# Patient Record
Sex: Male | Born: 1955 | Race: Black or African American | Hispanic: No | Marital: Married | Smoking: Former smoker
Health system: Southern US, Community
[De-identification: ages and names within clinical notes are randomized; demographics above are authoritative.]

## PROBLEM LIST (undated history)

## (undated) DIAGNOSIS — L309 Dermatitis, unspecified: Secondary | ICD-10-CM

## (undated) DIAGNOSIS — I10 Essential (primary) hypertension: Secondary | ICD-10-CM

## (undated) DIAGNOSIS — M109 Gout, unspecified: Secondary | ICD-10-CM

## (undated) DIAGNOSIS — K219 Gastro-esophageal reflux disease without esophagitis: Secondary | ICD-10-CM

## (undated) DIAGNOSIS — J9819 Other pulmonary collapse: Secondary | ICD-10-CM

## (undated) DIAGNOSIS — M1990 Unspecified osteoarthritis, unspecified site: Secondary | ICD-10-CM

## (undated) HISTORY — DX: Essential (primary) hypertension: I10

## (undated) HISTORY — PX: ROTATOR CUFF REPAIR: SHX139

## (undated) HISTORY — DX: Unspecified osteoarthritis, unspecified site: M19.90

## (undated) HISTORY — PX: KNEE ARTHROSCOPY: SUR90

## (undated) HISTORY — DX: Gastro-esophageal reflux disease without esophagitis: K21.9

---

## 2009-08-17 IMAGING — CR DG CHEST 2V
2 series · 2 of 2 positions shown · non-contrast
Comparison: None

CLINICAL DATA: Preoperative evaluation for rotator cuff tear repair
and biceps tendon tear repair.  Nonsmoker.  Right lung surgery 6
years ago.

CHEST - 2 VIEW

[view not recorded (1 of 2)]
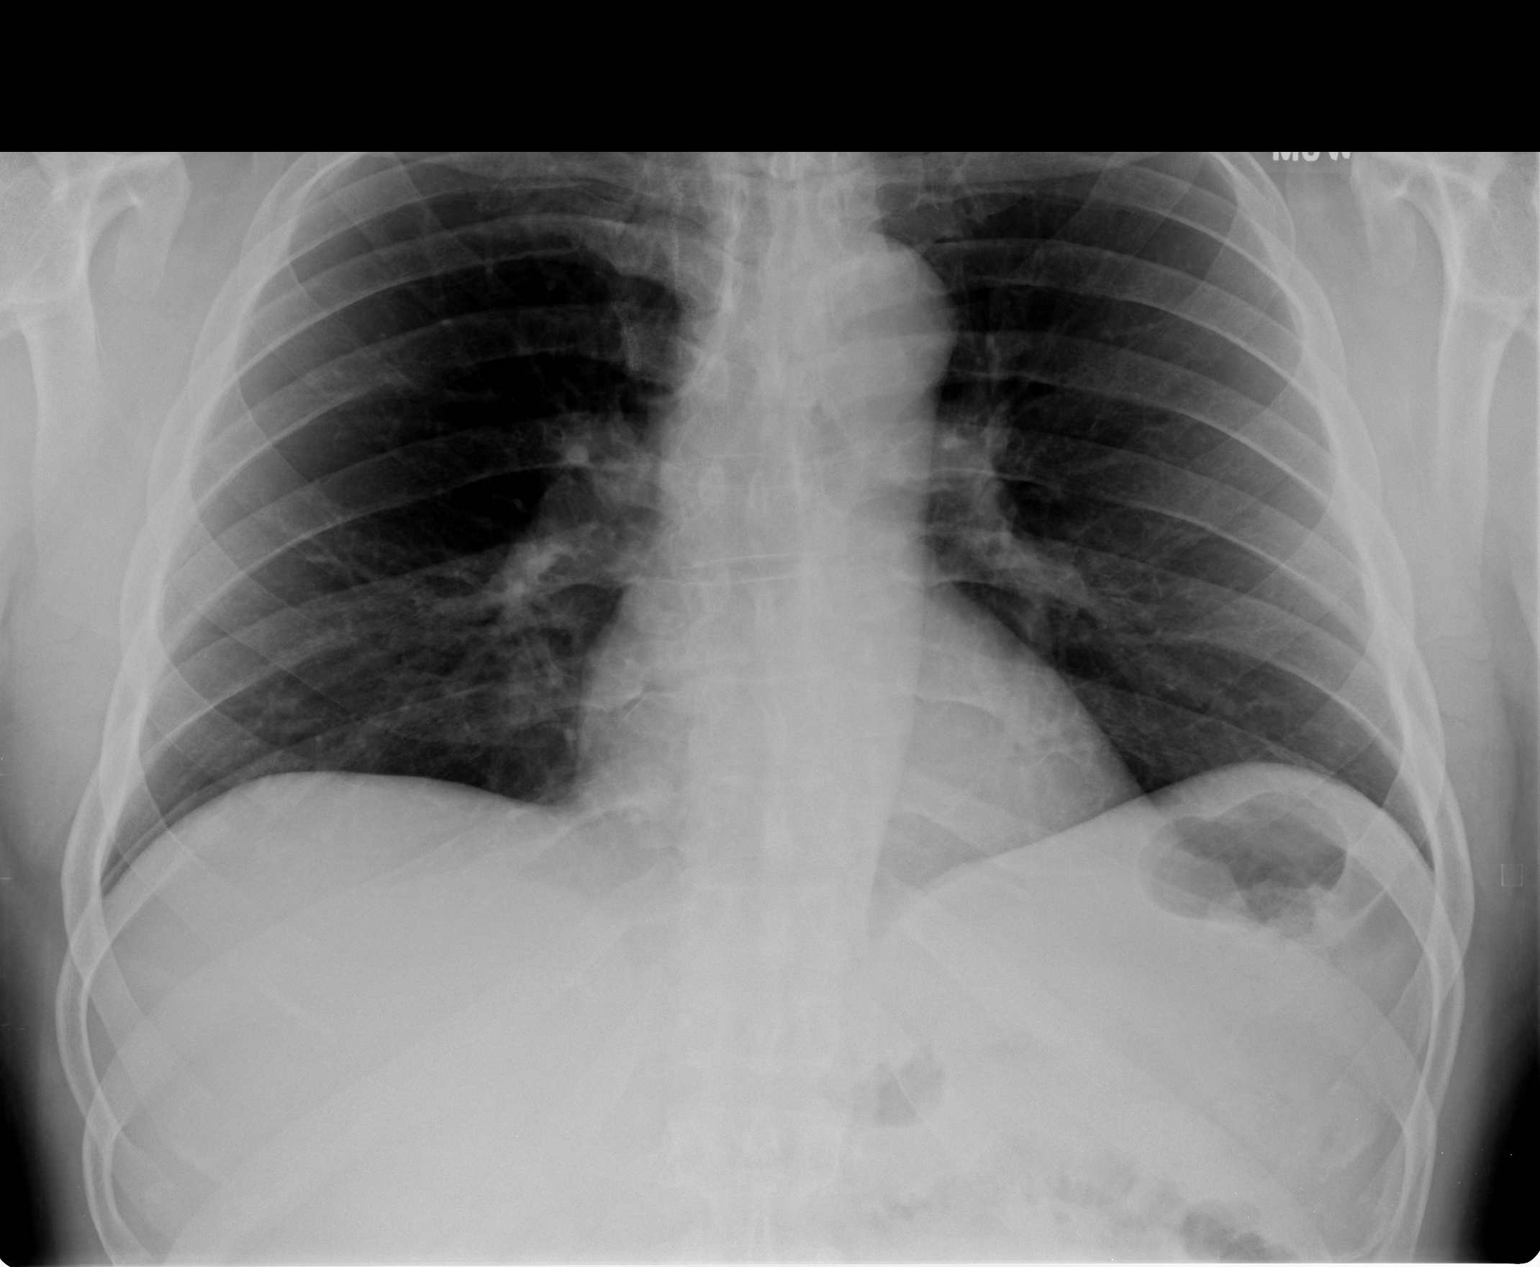

[view not recorded (2 of 2)]
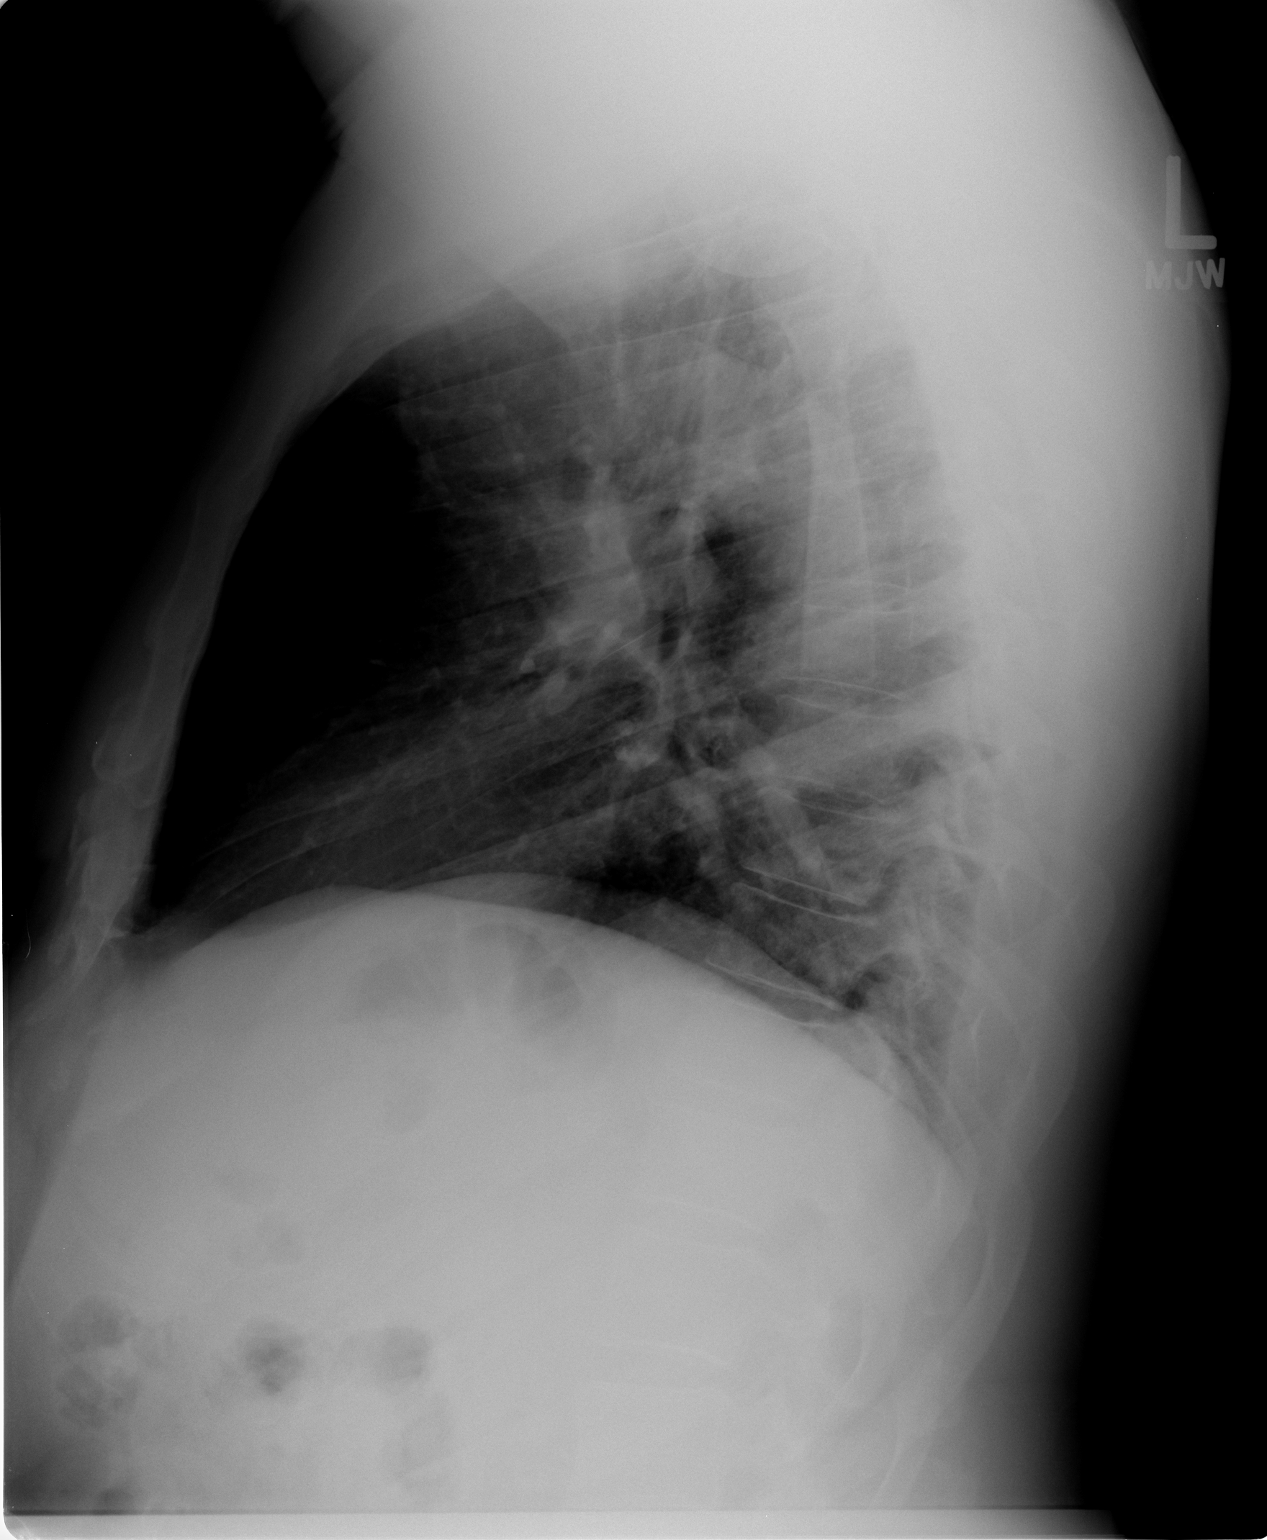

[2 of 2 positions shown; findings below may reference images not displayed]

FINDINGS: Heart and mediastinal contours are within normal limits.
Suture material is identified associated with the medial apical
portion of the right lung and superior right hilum.  The lung
fields are clear with no evidence for focal infiltrate or
congestive failure.  Mild bilateral apical pleural thickening is
seen.  No pleural effusions are noted.  Bony structures appear
intact.
IMPRESSION: Findings compatible with prior history of right lung surgery.  No
acute or focal abnormalities otherwise apparent.

## 2009-08-20 ENCOUNTER — Ambulatory Visit (HOSPITAL_COMMUNITY): Admission: RE | Admit: 2009-08-20 | Discharge: 2009-08-20 | Payer: Self-pay | Admitting: Orthopaedic Surgery

## 2011-03-29 LAB — BASIC METABOLIC PANEL
BUN: 9 mg/dL (ref 6–23)
CO2: 28 mEq/L (ref 19–32)
Calcium: 9.6 mg/dL (ref 8.4–10.5)
Creatinine, Ser: 1.18 mg/dL (ref 0.4–1.5)
GFR calc Af Amer: >60 mL/min (ref >60)
Glucose, Bld: 94 mg/dL (ref 70–99)

## 2011-03-29 LAB — CBC
MCHC: 34 g/dL (ref 30.0–36.0)
MCV: 87.2 fL (ref 78.0–100.0)
Platelets: 203 10*3/uL (ref 150–400)
RDW: 13.3 % (ref 11.5–15.5)

## 2011-03-29 LAB — DIFFERENTIAL
Basophils Absolute: 0 10*3/uL (ref 0.0–0.1)
Basophils Relative: 1 % (ref 0–1)
Eosinophils Absolute: 0.2 10*3/uL (ref 0.0–0.7)
Neutro Abs: 2 10*3/uL (ref 1.7–7.7)
Neutrophils Relative %: 48 % (ref 43–77)

## 2011-05-06 NOTE — Op Note (Signed)
NAMEJAYREN, Martin Mckee              ACCOUNT NO.:  [id number]   MEDICAL RECORD NO.:  [id number]          PATIENT TYPE:  AMB   LOCATION:  SDS                          FACILITY:  [facility]   PHYSICIAN:  Mark C. Ophelia Charter, M.D.    DATE OF BIRTH:  [birth year]   DATE OF PROCEDURE:  08/20/2009  DATE OF DISCHARGE:                               OPERATIVE REPORT   PREOPERATIVE DIAGNOSES:  Left rotator cuff tear, biceps tendinopathy,  split, tearing and biceps anchor superior labrum anterior-posterior  tear.   POSTOPERATIVE DIAGNOSES:  Left shoulder full-thickness rotator cuff tear  with no retraction, biceps tendon partial tear, and superior labrum  anterior-posterior tear.   PROCEDURES:  Diagnostic operative arthroscopy, left shoulder.  Debridement of glenohumeral joint grade 2 and mild grade 3  chondromalacia.  Debridement of superior labrum anterior-posterior  labral tear.  Biceps release, mini open rotator cuff repair,  acromioplasty, and biceps tenodesis.   SURGEON:  Mark C. Ophelia Charter, MD   ANESTHESIA:  General plus scalene block.   ESTIMATED BLOOD LOSS:  Minimal.   PROCEDURE IN DETAIL:  After induction of general anesthesia orotracheal  intubation, preoperative scalene block, and prepping and draping with  the patient on the shoulder frame beach-chair position, standard time-  out procedure was completed.  DuraPrep was used and impervious  stockinette, Coban, split sheets, drapes, and arthroscopic pouch was  applied.  Inflow was placed through the scope from posterior portal.  Anterior portal was made with an in-and-out technique and inflow device  in the biceps tendon.  No evidence of instability.  There was some grade  2 changes and mild area of grade 3 changes on the inferior aspect of the  humeral head and inferior aspect of the glenoid which was slightly  trimmed.  Labrum showed tearing consistent with a SLAP-2 tear with  fraying and there was tearing in the undersurface of the rotator  cuff  and the supraspinatus with egress of fluid out through a full-thickness  tear.  Biceps tendon at it leaving the shoulder showed partial tearing  with frayed edges.  Labrum was debrided back and then flat baskets and  straight arthroscopic scissors were used to release the biceps tendon  directly off the top of labrum.  The scope light was turned off and  incision was made along the anterior aspect of the acromion.  The  deltoid was peeled off.  Bursectomy was performed.  There was egress of  fluid out through full-thickness rotator cuff tear.  Biceps showed  significant tearing.  Subscap was intact.  With the shoulder flexed just  past 90 degrees, elbow maximally flexed.  Biceps tendon was identified  in the groove, delivered with the right-angle clamp.  The groove was  freshened up with a 1/4-inch osteotome and hole was drilled with a 7-mm  drill over the K-wire.  Tendon was sized and #2 FiberWire was placed in  a Bunnell weave back and forth through the tendon.  Long-arm was passed  through the tip of the screwdriver, then held the 7 x 23 Bio-Tenodesis  screw.  The tip of the screw  was shoved down into the hole with the  tendon and the screw was advanced next to the tendon until it was  buried.  Two inches of the suture were tied locking the repair.  It was  able to reach full extension and tendon went into the hole solidly.   The biceps tendon was then cut back a few millimeters to good fresh  surface.  The bone on the greater tuberosity was freshened up with a  rongeur.  Two UltraFix anchors were placed lateral to this and then  suture repair of the tendon pulling it down the freshened bone surface  and then doing a double repair by taking the edge and suturing it down  laterally to the stump of the old tendon where it had torn.  Shoulder  was rotated.  Acromioplasty had been performed prior to bursectomy and  the biceps tenodesis with 3/4 straight osteotome.  Undersurface of  the  acromion was smooth.  The edges of it were rasped and filed down  smoothly.  Distal aspect of the clavicle was not prominent.  Deltoid was  then repaired back through holes and the bone made with UltraFix needles  anatomically repairing the deltoid back to the acromion.  A 1-cm split  in the deltoid was repaired, 2-0 Vicryl in subcutaneous tissue, and 4-0  Vicryl subcuticular skin closure.  Nylon sutures in the anterior and  posterior portal, simple suture, postop dressing, 4 x 4s, ABD tape, and  shoulder immobilizer.  Instrument count and needle count was correct.      Mark C. Ophelia Charter, M.D.  Electronically Signed     MCY/MEDQ  D:  08/20/2009  T:  08/21/2009  Job:  [id number]

## 2019-01-20 ENCOUNTER — Ambulatory Visit (INDEPENDENT_AMBULATORY_CARE_PROVIDER_SITE_OTHER): Payer: Self-pay | Admitting: Orthopaedic Surgery

## 2020-01-19 ENCOUNTER — Ambulatory Visit: Payer: Self-pay | Admitting: Orthopaedic Surgery

## 2020-01-26 ENCOUNTER — Ambulatory Visit: Payer: Self-pay | Admitting: Orthopaedic Surgery

## 2020-09-21 ENCOUNTER — Telehealth: Payer: Self-pay | Admitting: Orthopaedic Surgery

## 2020-09-21 NOTE — Telephone Encounter (Signed)
Discussed he will make appt for [facility] and come in for xrays and discussion about TKA.

## 2020-09-21 NOTE — Telephone Encounter (Addendum)
Patient called about scheduling total knee surgery with Dr. Ophelia Charter.  He also wanted to know how he would go about getting an MRI.  Last office visit was 01-26-20 at the [facility] location.  Patient said he would feel better about proceeding with surgery if he could see Dr. Ophelia Charter and talk to him about doing both knees at the same time. Patient is scheduled for an appointment at 9am this Thursday, 09/27/20 in the [place] office.

## 2020-09-27 ENCOUNTER — Ambulatory Visit: Payer: Medicare HMO | Admitting: Orthopaedic Surgery

## 2020-09-27 ENCOUNTER — Other Ambulatory Visit: Payer: Self-pay

## 2020-10-04 ENCOUNTER — Ambulatory Visit (INDEPENDENT_AMBULATORY_CARE_PROVIDER_SITE_OTHER): Payer: Medicare HMO

## 2020-10-04 ENCOUNTER — Ambulatory Visit (INDEPENDENT_AMBULATORY_CARE_PROVIDER_SITE_OTHER): Payer: Medicare HMO | Admitting: Orthopaedic Surgery

## 2020-10-04 ENCOUNTER — Encounter: Payer: Self-pay | Admitting: Orthopaedic Surgery

## 2020-10-04 VITALS — BP 175/125 | HR 94 | Ht 73.0 in | Wt 250.0 lb

## 2020-10-04 DIAGNOSIS — M1712 Unilateral primary osteoarthritis, left knee: Secondary | ICD-10-CM

## 2020-10-04 DIAGNOSIS — M25562 Pain in left knee: Secondary | ICD-10-CM | POA: Diagnosis not present

## 2020-10-04 DIAGNOSIS — G8929 Other chronic pain: Secondary | ICD-10-CM | POA: Diagnosis not present

## 2020-10-04 NOTE — Progress Notes (Signed)
Office Visit Note   Patient: Martin Mckee           Date of Birth: September 21, 1956           MRN: [id number] Visit Date: 10/04/2020              Requested by: No referring provider defined for this encounter. PCP: Patient, No Pcp Per   Assessment & Plan: Visit Diagnoses:  1. Chronic pain of left knee     Plan: Patient is put off total knee arthroplasty for several years.  He has bone-on-bone changes with more severe changes in symptoms on the left knee than right knee.  He has had previous injections anti-inflammatories.  Questions were elicited and answered.  Plan would be left total knee arthroplasty.  He could later have the right knee done if needed.  We discussed outpatient therapy spinal anesthetic, Exparel, CPM use, home therapy.  Risk surgery discussed.  He'll need to get his blood pressure under control and have medical clearance.  He'll make an appointment for his medical follow-up now and then proceed with surgical scheduling.  We discussed some delays elective surgery due to Covid backup of cases.  Follow-Up Instructions: Preop for left total knee arthroplasty.  Orders:  Orders Placed This Encounter  Procedures  . XR KNEE 3 VIEW LEFT   No orders of the defined types were placed in this encounter.     Procedures: No procedures performed   Clinical Data: No additional findings.   Subjective: Chief Complaint  Patient presents with  . Left Knee - Pain    HPI 64 year old male returns with left greater than right end-stage knee osteoarthritis.  I follow him for more than 5 years with progressive knee osteoarthritis previously treated with Aleve, Tylenol, ice, and intermittent use of a cane, topical creams.  Previous shoulder rotator cuff repair on the left by me 2012.  Patient is here with his wife.  Does have hypertension and and BP is elevated today at 175/125.  No history of diabetes.  Review of Systems for history of gout on allopurinol.  Positive for hypertension  elevated today.  Positive bilateral knee osteoarthritis.   Objective: Vital Signs: BP (!) 175/125   Pulse 94   Ht 6\' 1"  (1.854 m)   Wt 250 lb (113.4 kg)   BMI 32.98 kg/m   Physical Exam Constitutional:      Appearance: He is well-developed.  HENT:     Head: Normocephalic and atraumatic.  Eyes:     Pupils: Pupils are equal, round, and reactive to light.  Neck:     Thyroid: No thyromegaly.     Trachea: No tracheal deviation.  Cardiovascular:     Rate and Rhythm: Normal rate.  Pulmonary:     Effort: Pulmonary effort is normal.     Breath sounds: No wheezing.  Abdominal:     General: Bowel sounds are normal.     Palpations: Abdomen is soft.  Skin:    General: Skin is warm and dry.     Capillary Refill: Capillary refill takes less than 2 seconds.  Neurological:     Mental Status: He is alert and oriented to person, place, and time.  Psychiatric:        Behavior: Behavior normal.        Thought Content: Thought content normal.        Judgment: Judgment normal.     Ortho Exam patient has good quad strength negative logroll right and left hips.  He lacks 5 degrees reaching full extension both right and left knee.  He can flex 110 degrees.  Collateral ligaments are stable medial and lateral joint line pain.  Significant crepitus with knee range of motion. Specialty Comments:  No specialty comments available.  Imaging: No results found.   PMFS History: There are no problems to display for this patient.  Past Medical History:  Diagnosis Date  . Arthritis   . GERD (gastroesophageal reflux disease)   . Hypertension     No family history on file.  Past Surgical History:  Procedure Laterality Date  . KNEE ARTHROSCOPY     Social History   Occupational History  . Not on file  Tobacco Use  . Smoking status: Former Smoker    Years: 10.00  . Smokeless tobacco: Never Used  Substance and Sexual Activity  . Alcohol use: Yes    Comment: occasionally  . Drug use: Not  on file  . Sexual activity: Not on file

## 2020-10-07 DIAGNOSIS — M1712 Unilateral primary osteoarthritis, left knee: Secondary | ICD-10-CM | POA: Insufficient documentation

## 2020-11-12 ENCOUNTER — Other Ambulatory Visit: Payer: Self-pay

## 2020-11-14 ENCOUNTER — Ambulatory Visit (INDEPENDENT_AMBULATORY_CARE_PROVIDER_SITE_OTHER): Payer: Medicare HMO | Admitting: Surgery

## 2020-11-14 ENCOUNTER — Other Ambulatory Visit: Payer: Self-pay

## 2020-11-14 ENCOUNTER — Encounter: Payer: Self-pay | Admitting: Surgery

## 2020-11-14 VITALS — BP 158/109 | HR 103 | Ht 73.0 in | Wt 252.8 lb

## 2020-11-14 DIAGNOSIS — M1712 Unilateral primary osteoarthritis, left knee: Secondary | ICD-10-CM

## 2020-11-14 NOTE — Progress Notes (Signed)
64 year old black male history of end-stage DJD left knee and pain comes in for preop evaluation.  States the left knee symptoms unchanged from previous visit.  He is want to proceed with left total knee replacement as scheduled.  We have received preop medical and cardiac clearance from Earley Abide, PA.  Today history and physical performed.  Blood pressure in clinic 158/109 and rechecked 147/103.  Review of systems negative.  There is some question as to whether or not he has prostate issues.  I do not see a diagnosis of BPH and not currently on any medication.  I did encourage him to get routine DRE's with his primary care provider who has offered this in the past but patient declined.  We did discuss the risk of prostate issues.  Patient's wife is present and he voices understanding.  Total knee replacement procedure discussed and all questions answered.

## 2020-11-21 NOTE — Progress Notes (Signed)
[facility] [place] - [address] [place] [address] Phone: [phone] Fax: [phone]      Your procedure is scheduled on Monday, December 6th.  Report to [facility] Main Entrance "A" at 1:00 P.M., and check in at the Admitting office.  Call this number if you have problems the morning of surgery:  [phone]  Call [phone] if you have any questions prior to your surgery date Monday-Friday 8am-4pm    Remember:  Do not eat after midnight the night before your surgery  You may drink clear liquids until 12:00 PM, the afternoon of your surgery.   Clear liquids allowed are: Water, Non-Citrus Juices (without pulp), Carbonated Beverages, Clear Tea, Black Coffee Only, and Gatorade    Take these medicines the morning of surgery with A SIP OF WATER   Allopurinol (Zyloprim)  Amlodipine (Norvasc)  Metoprolol   As of today, STOP taking any Aspirin (unless otherwise instructed by your surgeon) Aleve, Naproxen, Ibuprofen, Motrin, Advil, Goody's, BC's, all herbal medications, fish oil, and all vitamins.                      Do not wear jewelry            Do not wear lotions, powders, colognes, or deodorant.            Men may shave face and neck.            Do not bring valuables to the hospital.            [facility] is not responsible for any belongings or valuables.  Do NOT Smoke (Tobacco/Vaping) or drink Alcohol 24 hours prior to your procedure If you use a CPAP at night, you may bring all equipment for your overnight stay.   Contacts, glasses, dentures or bridgework may not be worn into surgery.      For patients admitted to the hospital, discharge time will be determined by your treatment team.   Patients discharged the day of surgery will not be allowed to drive home, and someone needs to stay with them for 24 hours.    Special instructions:   [facility]- Preparing For Surgery  Before surgery, you can play an important role.  Because skin is not sterile, your skin needs to be as free of germs as possible. You can reduce the number of germs on your skin by washing with CHG (chlorahexidine gluconate) Soap before surgery.  CHG is an antiseptic cleaner which kills germs and bonds with the skin to continue killing germs even after washing.    Oral Hygiene is also important to reduce your risk of infection.  Remember - BRUSH YOUR TEETH THE MORNING OF SURGERY WITH YOUR REGULAR TOOTHPASTE  Please do not use if you have an allergy to CHG or antibacterial soaps. If your skin becomes reddened/irritated stop using the CHG.  Do not shave (including legs and underarms) for at least 48 hours prior to first CHG shower. It is OK to shave your face.  Please follow these instructions carefully.   1. Shower the NIGHT BEFORE SURGERY and the MORNING OF SURGERY with CHG Soap.   2. If you chose to wash your hair, wash your hair first as usual with your normal shampoo.  3. After you shampoo, rinse your hair and body thoroughly to remove the shampoo.  4. Use CHG as you would any other liquid soap. You can apply  CHG directly to the skin and wash gently with a scrungie or a clean washcloth.   5. Apply the CHG Soap to your body ONLY FROM THE NECK DOWN.  Do not use on open wounds or open sores. Avoid contact with your eyes, ears, mouth and genitals (private parts). Wash Face and genitals (private parts)  with your normal soap.   6. Wash thoroughly, paying special attention to the area where your surgery will be performed.  7. Thoroughly rinse your body with warm water from the neck down.  8. DO NOT shower/wash with your normal soap after using and rinsing off the CHG Soap.  9. Pat yourself dry with a CLEAN TOWEL.  10. Wear CLEAN PAJAMAS to bed the night before surgery  11. Place CLEAN SHEETS on your bed the night of your first shower and DO NOT SLEEP WITH PETS.   Day of Surgery: Wear Clean/Comfortable clothing the morning of  surgery Do not apply any deodorants/lotions.   Remember to brush your teeth WITH YOUR REGULAR TOOTHPASTE.   Please read over the following fact sheets that you were given.

## 2020-11-22 ENCOUNTER — Encounter (HOSPITAL_COMMUNITY): Payer: Self-pay

## 2020-11-22 ENCOUNTER — Other Ambulatory Visit: Payer: Self-pay

## 2020-11-22 ENCOUNTER — Other Ambulatory Visit (HOSPITAL_COMMUNITY)
Admission: RE | Admit: 2020-11-22 | Discharge: 2020-11-22 | Disposition: A | Discharge status: Home / Self Care | Payer: Medicare HMO | Source: Ambulatory Visit | Attending: Orthopaedic Surgery | Admitting: Orthopaedic Surgery

## 2020-11-22 ENCOUNTER — Encounter (HOSPITAL_COMMUNITY)
Admission: RE | Admit: 2020-11-22 | Discharge: 2020-11-22 | Disposition: A | Discharge status: Home / Self Care | Payer: Medicare HMO | Source: Ambulatory Visit | Attending: Orthopaedic Surgery | Admitting: Orthopaedic Surgery

## 2020-11-22 DIAGNOSIS — I252 Old myocardial infarction: Secondary | ICD-10-CM | POA: Insufficient documentation

## 2020-11-22 DIAGNOSIS — I358 Other nonrheumatic aortic valve disorders: Secondary | ICD-10-CM | POA: Insufficient documentation

## 2020-11-22 DIAGNOSIS — Z20822 Contact with and (suspected) exposure to covid-19: Secondary | ICD-10-CM | POA: Insufficient documentation

## 2020-11-22 DIAGNOSIS — I10 Essential (primary) hypertension: Secondary | ICD-10-CM | POA: Diagnosis not present

## 2020-11-22 DIAGNOSIS — Z6832 Body mass index (BMI) 32.0-32.9, adult: Secondary | ICD-10-CM | POA: Insufficient documentation

## 2020-11-22 DIAGNOSIS — Z8673 Personal history of transient ischemic attack (TIA), and cerebral infarction without residual deficits: Secondary | ICD-10-CM | POA: Insufficient documentation

## 2020-11-22 DIAGNOSIS — E119 Type 2 diabetes mellitus without complications: Secondary | ICD-10-CM | POA: Insufficient documentation

## 2020-11-22 DIAGNOSIS — Z01812 Encounter for preprocedural laboratory examination: Secondary | ICD-10-CM | POA: Insufficient documentation

## 2020-11-22 DIAGNOSIS — I509 Heart failure, unspecified: Secondary | ICD-10-CM | POA: Insufficient documentation

## 2020-11-22 DIAGNOSIS — Z794 Long term (current) use of insulin: Secondary | ICD-10-CM | POA: Insufficient documentation

## 2020-11-22 DIAGNOSIS — Z01818 Encounter for other preprocedural examination: Secondary | ICD-10-CM | POA: Insufficient documentation

## 2020-11-22 DIAGNOSIS — E669 Obesity, unspecified: Secondary | ICD-10-CM | POA: Insufficient documentation

## 2020-11-22 DIAGNOSIS — I110 Hypertensive heart disease with heart failure: Secondary | ICD-10-CM | POA: Insufficient documentation

## 2020-11-22 HISTORY — DX: Other pulmonary collapse: J98.19

## 2020-11-22 HISTORY — DX: Dermatitis, unspecified: L30.9

## 2020-11-22 HISTORY — DX: Gout, unspecified: M10.9

## 2020-11-22 LAB — CBC
HCT: 52.4 % — ABNORMAL HIGH (ref 39.0–52.0)
Hemoglobin: 17 g/dL (ref 13.0–17.0)
MCH: 28.7 pg (ref 26.0–34.0)
MCHC: 32.4 g/dL (ref 30.0–36.0)
MCV: 88.4 fL (ref 80.0–100.0)
Platelets: 217 10*3/uL (ref 150–400)
RBC: 5.93 MIL/uL — ABNORMAL HIGH (ref 4.22–5.81)
RDW: 13.7 % (ref 11.5–15.5)
WBC: 5.8 10*3/uL (ref 4.0–10.5)
nRBC: 0 % (ref 0.0–0.2)

## 2020-11-22 LAB — BASIC METABOLIC PANEL
Anion gap: 8 (ref 5–15)
BUN: 11 mg/dL (ref 8–23)
CO2: 26 mmol/L (ref 22–32)
Calcium: 10.5 mg/dL — ABNORMAL HIGH (ref 8.9–10.3)
Chloride: 100 mmol/L (ref 98–111)
Creatinine, Ser: 1.23 mg/dL (ref 0.61–1.24)
GFR, Estimated: >60 mL/min (ref >60)
Glucose, Bld: 103 mg/dL — ABNORMAL HIGH (ref 70–99)
Potassium: 4.3 mmol/L (ref 3.5–5.1)
Sodium: 134 mmol/L — ABNORMAL LOW (ref 135–145)

## 2020-11-22 LAB — SARS CORONAVIRUS 2 (TAT 6-24 HRS): SARS Coronavirus 2: NEGATIVE

## 2020-11-22 LAB — SURGICAL PCR SCREEN
MRSA, PCR: NEGATIVE
Staphylococcus aureus: NEGATIVE

## 2020-11-22 LAB — NO BLOOD PRODUCTS

## 2020-11-22 NOTE — Progress Notes (Signed)
PCP - Dr Benita Gutter    And pa mary matthews    Cardiologist - na    hest x-ray - na EKG - today  leep Study - yes CPAP - no  spirin Instructions:stop  ERAS Protcol -clear liqs  Instructions given COVID TEST- did today   Anesthesia review: htn      Clearance in chart per pcp  Patient denies shortness of breath, fever, cough and chest pain at PAT appointment   All instructions explained to the patient, with a verbal understanding of the material. Patient agrees to go over the instructions while at home for a better understanding. Patient also instructed to self quarantine after being tested for COVID-19. The opportunity to ask questions was provided.

## 2020-11-23 MED ORDER — BUPIVACAINE LIPOSOME 1.3 % IJ SUSP
20.0000 mL | INTRAMUSCULAR | Status: AC
  Administered 2020-11-26: 20 mL
  Filled 2020-11-23 (×2): qty 20

## 2020-11-23 NOTE — Progress Notes (Signed)
Anesthesia Chart Review:  Patient has preop clearance from PCP dated 10/23/2020, copy on chart.  Note states, "obtained previous cardiology work-up from 05/2014 noting Lexiscan Myoview negative for ischemia, echo with EF 70%, mild aortic valve sclerosis.  Review cholesterol panel 12/2019 controlled.  Asymptomatic without history of prior MI, CHF, CVA, IDDM.  Creatinine 09/2020 1.09.  Revised cardiac risk index for preoperative risk: Class I.  Patient with low intermediate risk given history of hypertension and obesity."  BP elevated at PAT 127/99.  Per PCP notes, patient recorded home blood pressures are under better control.  Preop labs reviewed, unremarkable.  EKG 11/22/2020: NSR.  Rate 76.  Nonspecific ST-T wave abnormality.   [facility] Stay [facility]/Anesthesiology Phone [phone] 11/23/2020 9:23 AM

## 2020-11-23 NOTE — Anesthesia Preprocedure Evaluation (Addendum)
Anesthesia Evaluation  Patient identified by MRN, date of birth, ID band Patient awake    Reviewed: Allergy & Precautions, NPO status , Patient's Chart, lab work & pertinent test results  Airway Mallampati: III  TM Distance: >3 FB Neck ROM: Full    Dental  (+) Loose,    Pulmonary former smoker,    Pulmonary exam normal breath sounds clear to auscultation       Cardiovascular hypertension, Pt. on medications and Pt. on home beta blockers Normal cardiovascular exam Rhythm:Regular Rate:Normal  ECG: NSR   Neuro/Psych negative neurological ROS  negative psych ROS   GI/Hepatic Neg liver ROS, GERD  Medicated and Controlled,  Endo/Other  negative endocrine ROS  Renal/GU negative Renal ROS     Musculoskeletal  (+) Arthritis , Gout   Abdominal (+) + obese,   Peds  Hematology  (+) REFUSES BLOOD PRODUCTS, JEHOVAH'S WITNESS  Anesthesia Other Findings LEFT KNEE OSTEOARTHRITIS  Reproductive/Obstetrics                           Anesthesia Physical Anesthesia Plan  ASA: II  Anesthesia Plan: Regional and Spinal   Post-op Pain Management:    Induction: Intravenous  PONV Risk Score and Plan: 1 and Ondansetron, Dexamethasone, Midazolam, Treatment may vary due to age or medical condition and Propofol infusion  Airway Management Planned: Simple Face Mask  Additional Equipment:   Intra-op Plan:   Post-operative Plan:   Informed Consent: I have reviewed the patients History and Physical, chart, labs and discussed the procedure including the risks, benefits and alternatives for the proposed anesthesia with the patient or authorized representative who has indicated his/her understanding and acceptance.     Dental advisory given  Plan Discussed with: CRNA  Anesthesia Plan Comments: (PAT note by Antionette Poles, PA-C: Patient has preop clearance from PCP dated 10/23/2020, copy on chart.  Note states,  "obtained previous cardiology work-up from 05/2014 noting Lexiscan Myoview negative for ischemia, echo with EF 70%, mild aortic valve sclerosis.  Review cholesterol panel 12/2019 controlled.  Asymptomatic without history of prior MI, CHF, CVA, IDDM.  Creatinine 09/2020 1.09.  Revised cardiac risk index for preoperative risk: Class I.  Patient with low intermediate risk given history of hypertension and obesity."  BP elevated at PAT 127/99.  Per PCP notes, patient recorded home blood pressures are under better control.  Preop labs reviewed, unremarkable.  EKG 11/22/2020: NSR.  Rate 76.  Nonspecific ST-T wave abnormality.  )      Anesthesia Quick Evaluation

## 2020-11-26 ENCOUNTER — Observation Stay (HOSPITAL_COMMUNITY)
Admission: RE | Admit: 2020-11-26 | Discharge: 2020-11-27 | Disposition: A | Discharge status: Home / Self Care | Payer: Medicare HMO | Attending: Orthopaedic Surgery | Admitting: Orthopaedic Surgery

## 2020-11-26 ENCOUNTER — Ambulatory Visit (HOSPITAL_COMMUNITY): Payer: Medicare HMO | Admitting: Certified Registered"

## 2020-11-26 ENCOUNTER — Encounter (HOSPITAL_COMMUNITY)
Admission: RE | Disposition: A | Discharge status: Home / Self Care | Payer: Self-pay | Source: Home / Self Care | Attending: Orthopaedic Surgery

## 2020-11-26 ENCOUNTER — Ambulatory Visit (HOSPITAL_COMMUNITY): Payer: Medicare HMO | Admitting: Physician Assistant

## 2020-11-26 ENCOUNTER — Observation Stay (HOSPITAL_COMMUNITY): Payer: Medicare HMO

## 2020-11-26 ENCOUNTER — Encounter (HOSPITAL_COMMUNITY): Payer: Self-pay | Admitting: Orthopaedic Surgery

## 2020-11-26 ENCOUNTER — Other Ambulatory Visit: Payer: Self-pay

## 2020-11-26 DIAGNOSIS — Z87891 Personal history of nicotine dependence: Secondary | ICD-10-CM | POA: Diagnosis not present

## 2020-11-26 DIAGNOSIS — I10 Essential (primary) hypertension: Secondary | ICD-10-CM | POA: Diagnosis not present

## 2020-11-26 DIAGNOSIS — Z09 Encounter for follow-up examination after completed treatment for conditions other than malignant neoplasm: Secondary | ICD-10-CM

## 2020-11-26 DIAGNOSIS — M1712 Unilateral primary osteoarthritis, left knee: Principal | ICD-10-CM | POA: Diagnosis present

## 2020-11-26 DIAGNOSIS — Z79899 Other long term (current) drug therapy: Secondary | ICD-10-CM | POA: Diagnosis not present

## 2020-11-26 DIAGNOSIS — M25562 Pain in left knee: Secondary | ICD-10-CM | POA: Diagnosis present

## 2020-11-26 HISTORY — PX: TOTAL KNEE ARTHROPLASTY: SHX125

## 2020-11-26 IMAGING — DX DG KNEE 1-2V*L*
2 series · 2 of 2 positions shown · non-contrast
Comparison: Radiograph [DATE]

CLINICAL DATA: Postop check.

EXAM:
LEFT KNEE - 1-2 VIEW

[knee ap]
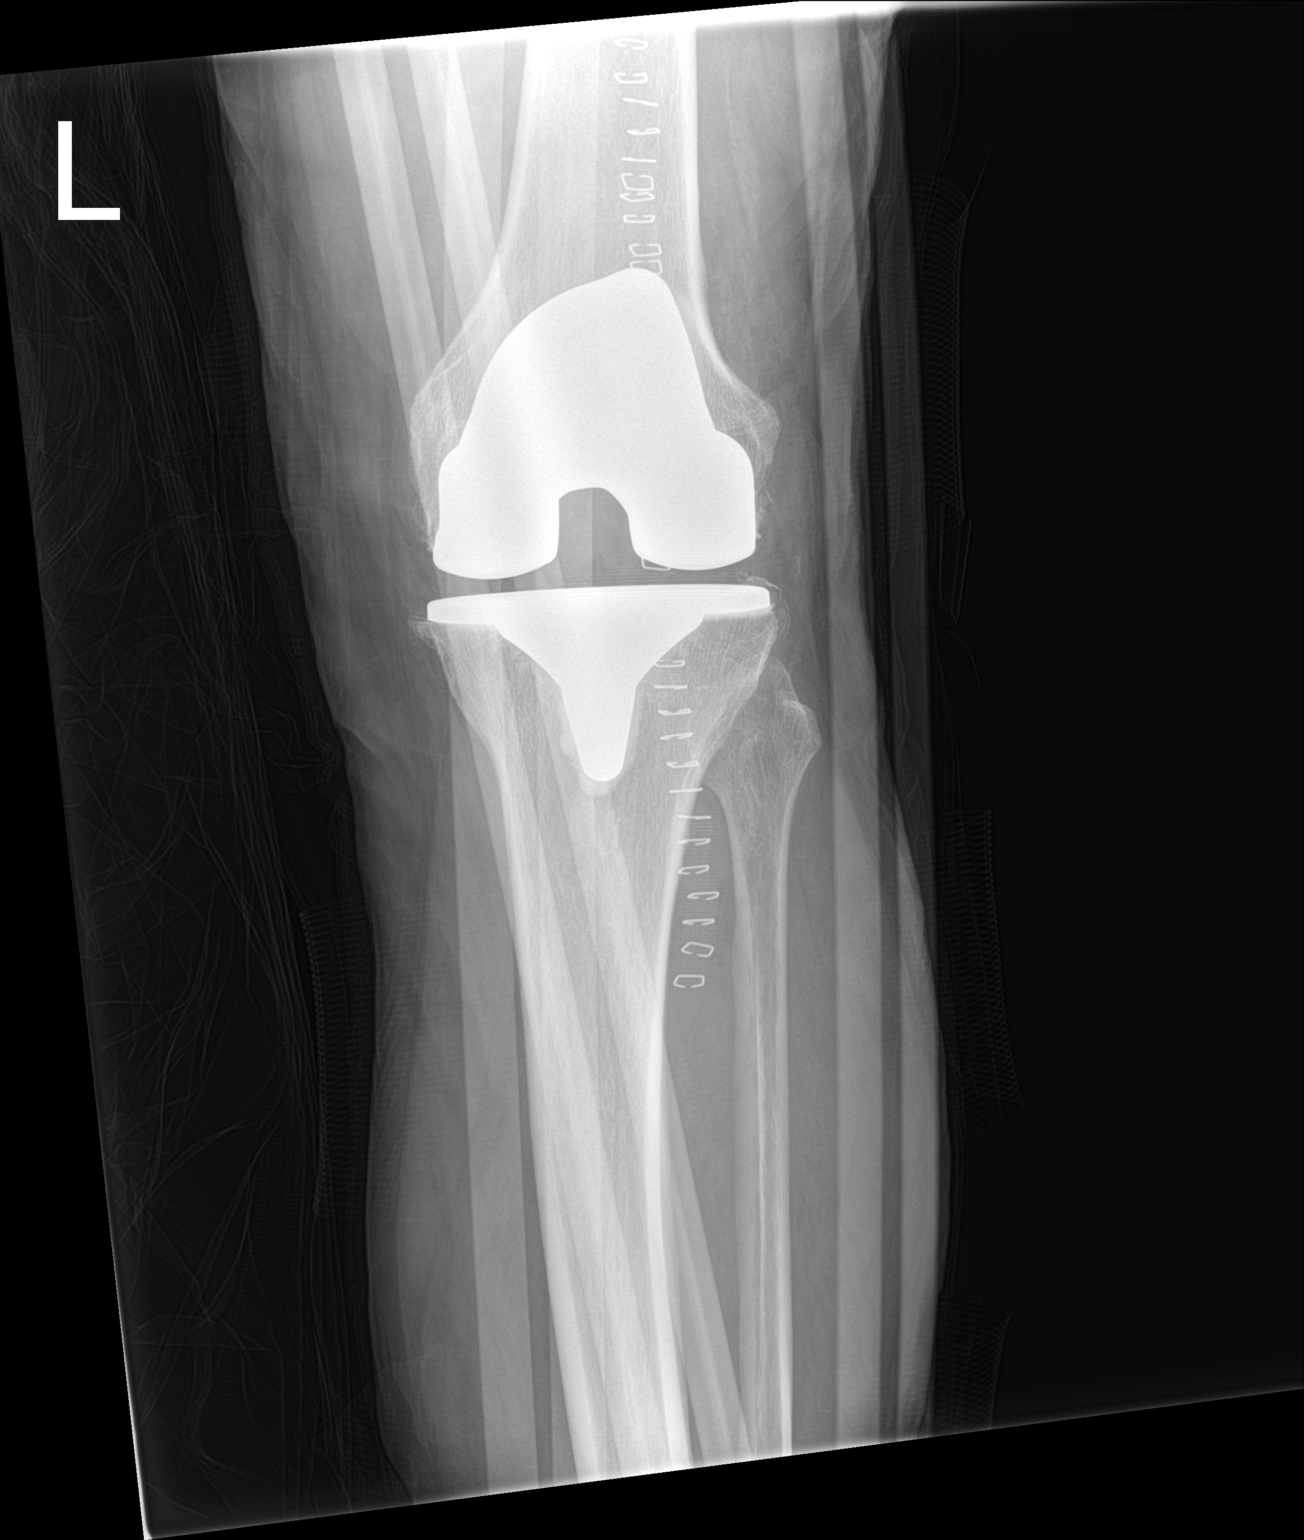

[knee lat]
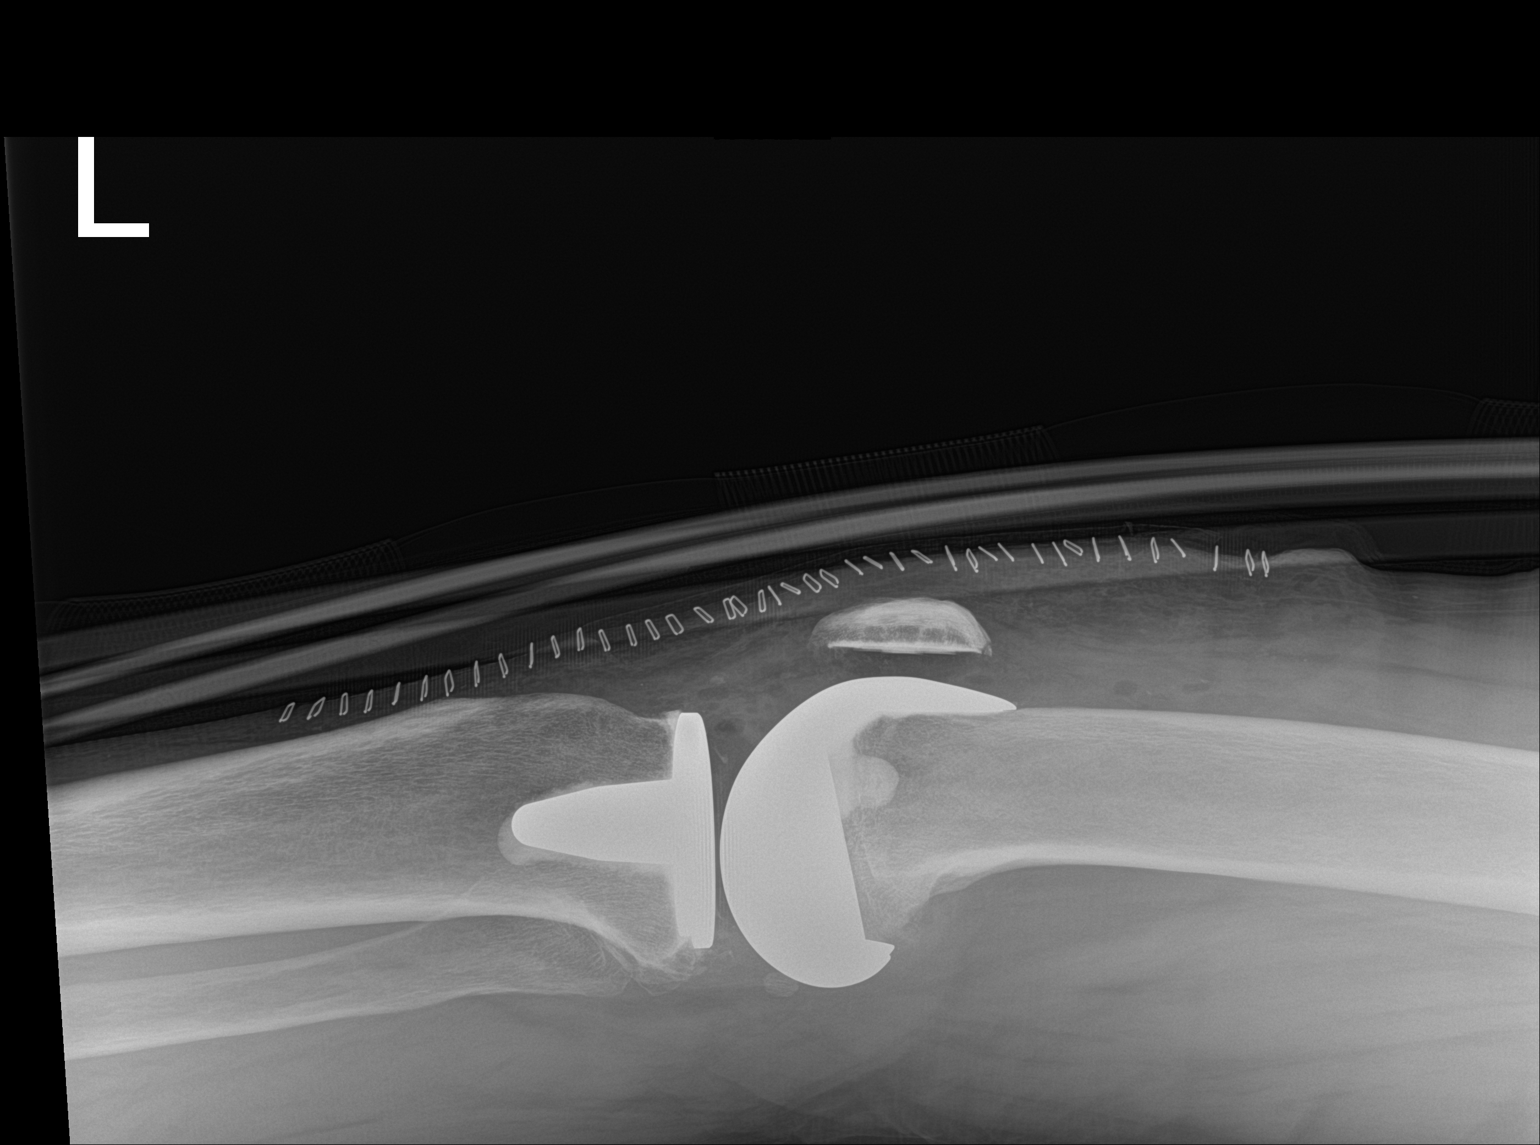

[2 of 2 positions shown; findings below may reference images not displayed]

FINDINGS: Patient has undergone left total knee arthroplasty. The hardware is
well positioned. There is no evidence of acute fracture or
dislocation. A small amount of gas is present within the joint and
soft tissue surrounding the knee. Anterior skin staples are in
place.
IMPRESSION: No demonstrated complication following left total knee arthroplasty.

## 2020-11-26 SURGERY — ARTHROPLASTY, KNEE, TOTAL
Anesthesia: Regional | Site: Knee | Laterality: Left

## 2020-11-26 MED ORDER — ONDANSETRON HCL 4 MG/2ML IJ SOLN
4.0000 mg | Freq: Four times a day (QID) | INTRAMUSCULAR | Status: DC | PRN
  Administered 2020-11-27: 4 mg via INTRAVENOUS
  Filled 2020-11-26: qty 2

## 2020-11-26 MED ORDER — AMLODIPINE BESYLATE 10 MG PO TABS
10.0000 mg | ORAL_TABLET | Freq: Every day | ORAL | Status: DC
  Administered 2020-11-27: 10 mg via ORAL
  Filled 2020-11-26: qty 1

## 2020-11-26 MED ORDER — BUPIVACAINE IN DEXTROSE 0.75-8.25 % IT SOLN
INTRATHECAL | Status: DC | PRN
  Administered 2020-11-26: 1.8 mL via INTRATHECAL

## 2020-11-26 MED ORDER — METHOCARBAMOL 1000 MG/10ML IJ SOLN
500.0000 mg | Freq: Four times a day (QID) | INTRAVENOUS | Status: DC | PRN
  Filled 2020-11-26: qty 5

## 2020-11-26 MED ORDER — OXYCODONE HCL 5 MG PO TABS
5.0000 mg | ORAL_TABLET | ORAL | Status: DC | PRN
  Administered 2020-11-26: 5 mg via ORAL
  Administered 2020-11-27 (×3): 10 mg via ORAL
  Filled 2020-11-26 (×3): qty 2
  Filled 2020-11-26: qty 1

## 2020-11-26 MED ORDER — ONDANSETRON HCL 4 MG/2ML IJ SOLN
INTRAMUSCULAR | Status: DC | PRN
  Administered 2020-11-26: 4 mg via INTRAVENOUS

## 2020-11-26 MED ORDER — PROPOFOL 500 MG/50ML IV EMUL
INTRAVENOUS | Status: DC | PRN
  Administered 2020-11-26: 75 ug/kg/min via INTRAVENOUS

## 2020-11-26 MED ORDER — OXYCODONE HCL 5 MG PO TABS: 5.0000 mg | ORAL_TABLET | Freq: Once | ORAL | Status: DC | PRN

## 2020-11-26 MED ORDER — CHLORHEXIDINE GLUCONATE 0.12 % MT SOLN: 15.0000 mL | Freq: Once | OROMUCOSAL | Status: AC

## 2020-11-26 MED ORDER — METOCLOPRAMIDE HCL 5 MG PO TABS: 5.0000 mg | ORAL_TABLET | Freq: Three times a day (TID) | ORAL | Status: DC | PRN

## 2020-11-26 MED ORDER — MENTHOL 3 MG MT LOZG: 1.0000 | LOZENGE | OROMUCOSAL | Status: DC | PRN

## 2020-11-26 MED ORDER — ALLOPURINOL 100 MG PO TABS
200.0000 mg | ORAL_TABLET | Freq: Three times a day (TID) | ORAL | Status: DC
  Administered 2020-11-26 – 2020-11-27 (×2): 200 mg via ORAL
  Filled 2020-11-26 (×2): qty 2

## 2020-11-26 MED ORDER — CHLORHEXIDINE GLUCONATE 0.12 % MT SOLN
OROMUCOSAL | Status: AC
  Administered 2020-11-26: 15 mL via OROMUCOSAL
  Filled 2020-11-26: qty 15

## 2020-11-26 MED ORDER — PROPOFOL 10 MG/ML IV BOLUS
INTRAVENOUS | Status: DC | PRN
  Administered 2020-11-26: 20 mg via INTRAVENOUS
  Administered 2020-11-26: 40 mg via INTRAVENOUS

## 2020-11-26 MED ORDER — ACETAMINOPHEN 10 MG/ML IV SOLN: 1000.0000 mg | Freq: Once | INTRAVENOUS | Status: DC | PRN

## 2020-11-26 MED ORDER — BUPIVACAINE-EPINEPHRINE (PF) 0.5% -1:200000 IJ SOLN
INTRAMUSCULAR | Status: DC | PRN
  Administered 2020-11-26: 30 mL via PERINEURAL

## 2020-11-26 MED ORDER — POLYETHYLENE GLYCOL 3350 17 G PO PACK: 17.0000 g | PACK | Freq: Every day | ORAL | Status: DC | PRN

## 2020-11-26 MED ORDER — ORAL CARE MOUTH RINSE: 15.0000 mL | Freq: Once | OROMUCOSAL | Status: AC

## 2020-11-26 MED ORDER — MIDAZOLAM HCL 2 MG/2ML IJ SOLN
INTRAMUSCULAR | Status: AC
  Administered 2020-11-26: 2 mg via INTRAVENOUS
  Filled 2020-11-26: qty 2

## 2020-11-26 MED ORDER — FENTANYL CITRATE (PF) 100 MCG/2ML IJ SOLN
INTRAMUSCULAR | Status: AC
  Filled 2020-11-26: qty 2

## 2020-11-26 MED ORDER — PANTOPRAZOLE SODIUM 40 MG PO TBEC
40.0000 mg | DELAYED_RELEASE_TABLET | Freq: Every day | ORAL | Status: DC
  Administered 2020-11-27: 40 mg via ORAL
  Filled 2020-11-26: qty 1

## 2020-11-26 MED ORDER — IRBESARTAN 300 MG PO TABS
300.0000 mg | ORAL_TABLET | Freq: Every day | ORAL | Status: DC
  Administered 2020-11-27: 300 mg via ORAL
  Filled 2020-11-26: qty 1

## 2020-11-26 MED ORDER — TRANEXAMIC ACID-NACL 1000-0.7 MG/100ML-% IV SOLN
INTRAVENOUS | Status: DC | PRN
  Administered 2020-11-26: 1000 mg via INTRAVENOUS

## 2020-11-26 MED ORDER — ONDANSETRON HCL 4 MG PO TABS: 4.0000 mg | ORAL_TABLET | Freq: Four times a day (QID) | ORAL | Status: DC | PRN

## 2020-11-26 MED ORDER — PROPOFOL 10 MG/ML IV BOLUS
INTRAVENOUS | Status: AC
  Filled 2020-11-26: qty 20

## 2020-11-26 MED ORDER — SODIUM CHLORIDE 0.9 % IV SOLN: INTRAVENOUS | Status: DC

## 2020-11-26 MED ORDER — CLOBETASOL PROPIONATE 0.05 % EX CREA
1.0000 "application " | TOPICAL_CREAM | Freq: Every day | CUTANEOUS | Status: DC
  Administered 2020-11-27: 1 via TOPICAL
  Filled 2020-11-26: qty 15

## 2020-11-26 MED ORDER — METOCLOPRAMIDE HCL 5 MG/ML IJ SOLN: 5.0000 mg | Freq: Three times a day (TID) | INTRAMUSCULAR | Status: DC | PRN

## 2020-11-26 MED ORDER — PROMETHAZINE HCL 25 MG/ML IJ SOLN: 6.2500 mg | INTRAMUSCULAR | Status: DC | PRN

## 2020-11-26 MED ORDER — PHENYLEPHRINE HCL-NACL 10-0.9 MG/250ML-% IV SOLN
INTRAVENOUS | Status: DC | PRN
  Administered 2020-11-26: 40 ug/min via INTRAVENOUS

## 2020-11-26 MED ORDER — DOCUSATE SODIUM 100 MG PO CAPS
100.0000 mg | ORAL_CAPSULE | Freq: Two times a day (BID) | ORAL | Status: DC
  Administered 2020-11-26 – 2020-11-27 (×2): 100 mg via ORAL
  Filled 2020-11-26 (×2): qty 1

## 2020-11-26 MED ORDER — MIDAZOLAM HCL 2 MG/2ML IJ SOLN: 2.0000 mg | Freq: Once | INTRAMUSCULAR | Status: AC

## 2020-11-26 MED ORDER — BUPIVACAINE HCL (PF) 0.25 % IJ SOLN
INTRAMUSCULAR | Status: DC | PRN
  Administered 2020-11-26: 20 mL

## 2020-11-26 MED ORDER — CEFAZOLIN SODIUM-DEXTROSE 2-4 GM/100ML-% IV SOLN
2.0000 g | INTRAVENOUS | Status: AC
  Administered 2020-11-26: 2 g via INTRAVENOUS
  Filled 2020-11-26: qty 100

## 2020-11-26 MED ORDER — LACTATED RINGERS IV SOLN: INTRAVENOUS | Status: DC

## 2020-11-26 MED ORDER — ASPIRIN EC 325 MG PO TBEC
325.0000 mg | DELAYED_RELEASE_TABLET | Freq: Every day | ORAL | Status: DC
  Administered 2020-11-27: 325 mg via ORAL
  Filled 2020-11-26: qty 1

## 2020-11-26 MED ORDER — LIDOCAINE 2% (20 MG/ML) 5 ML SYRINGE
INTRAMUSCULAR | Status: DC | PRN
  Administered 2020-11-26: 40 mg via INTRAVENOUS

## 2020-11-26 MED ORDER — HYDROMORPHONE HCL 1 MG/ML IJ SOLN
0.5000 mg | INTRAMUSCULAR | Status: DC | PRN
  Administered 2020-11-26 – 2020-11-27 (×2): 0.5 mg via INTRAVENOUS
  Administered 2020-11-27: 1 mg via INTRAVENOUS
  Filled 2020-11-26 (×3): qty 1

## 2020-11-26 MED ORDER — ACETAMINOPHEN 325 MG PO TABS: 325.0000 mg | ORAL_TABLET | Freq: Four times a day (QID) | ORAL | Status: DC | PRN

## 2020-11-26 MED ORDER — PHENOL 1.4 % MT LIQD: 1.0000 | OROMUCOSAL | Status: DC | PRN

## 2020-11-26 MED ORDER — OXYCODONE HCL 5 MG/5ML PO SOLN: 5.0000 mg | Freq: Once | ORAL | Status: DC | PRN

## 2020-11-26 MED ORDER — METOPROLOL SUCCINATE ER 50 MG PO TB24
50.0000 mg | ORAL_TABLET | Freq: Every day | ORAL | Status: DC
  Administered 2020-11-27: 50 mg via ORAL
  Filled 2020-11-26: qty 1

## 2020-11-26 MED ORDER — METHOCARBAMOL 500 MG PO TABS: 500.0000 mg | ORAL_TABLET | Freq: Four times a day (QID) | ORAL | Status: DC | PRN

## 2020-11-26 MED ORDER — SODIUM CHLORIDE 0.9 % IR SOLN
Status: DC | PRN
  Administered 2020-11-26: 3000 mL
  Administered 2020-11-26: 1000 mL

## 2020-11-26 MED ORDER — FENTANYL CITRATE (PF) 100 MCG/2ML IJ SOLN: 25.0000 ug | INTRAMUSCULAR | Status: DC | PRN

## 2020-11-26 MED ORDER — TRANEXAMIC ACID-NACL 1000-0.7 MG/100ML-% IV SOLN
INTRAVENOUS | Status: AC
  Filled 2020-11-26: qty 100

## 2020-11-26 MED ORDER — BUPIVACAINE HCL (PF) 0.25 % IJ SOLN
INTRAMUSCULAR | Status: AC
  Filled 2020-11-26: qty 30

## 2020-11-26 SURGICAL SUPPLY — 78 items
ATTUNE MED DOME PAT 38 KNEE (Knees) ×2 IMPLANT
ATTUNE MED DOME PAT 38MM KNEE (Knees) ×1 IMPLANT
ATTUNE PS FEM LT SZ 8 CEM KNEE (Femur) ×3 IMPLANT
ATTUNE PSRP INSR SZ8 5 KNEE (Insert) ×2 IMPLANT
ATTUNE PSRP INSR SZ8 5MM KNEE (Insert) ×1 IMPLANT
BANDAGE ESMARK 6X9 LF (GAUZE/BANDAGES/DRESSINGS) ×1 IMPLANT
BASE TIBIAL ROT PLAT SZ 8 KNEE (Knees) ×1 IMPLANT
BENZOIN TINCTURE PRP APPL 2/3 (GAUZE/BANDAGES/DRESSINGS) ×3 IMPLANT
BLADE SAGITTAL 25.0X1.19X90 (BLADE) ×2 IMPLANT
BLADE SAGITTAL 25.0X1.19X90MM (BLADE) ×1
BLADE SAW SGTL 13X75X1.27 (BLADE) ×3 IMPLANT
BNDG ELASTIC 4X5.8 VLCR STR LF (GAUZE/BANDAGES/DRESSINGS) ×3 IMPLANT
BNDG ELASTIC 6X10 VLCR STRL LF (GAUZE/BANDAGES/DRESSINGS) ×3 IMPLANT
BNDG ELASTIC 6X5.8 VLCR STR LF (GAUZE/BANDAGES/DRESSINGS) ×6 IMPLANT
BNDG ESMARK 6X9 LF (GAUZE/BANDAGES/DRESSINGS) ×3
BOWL SMART MIX CTS (DISPOSABLE) ×3 IMPLANT
CEMENT HV SMART SET (Cement) ×6 IMPLANT
COVER SURGICAL LIGHT HANDLE (MISCELLANEOUS) ×3 IMPLANT
COVER WAND RF STERILE (DRAPES) ×3 IMPLANT
CUFF TOURN SGL QUICK 34 (TOURNIQUET CUFF) ×2
CUFF TOURN SGL QUICK 42 (TOURNIQUET CUFF) IMPLANT
CUFF TRNQT CYL 34X4.125X (TOURNIQUET CUFF) ×1 IMPLANT
DRAPE ORTHO SPLIT 77X108 STRL (DRAPES) ×4
DRAPE SURG ORHT 6 SPLT 77X108 (DRAPES) ×2 IMPLANT
DRAPE U-SHAPE 47X51 STRL (DRAPES) ×3 IMPLANT
DRSG MEPILEX BORDER 4X12 (GAUZE/BANDAGES/DRESSINGS) ×3 IMPLANT
DRSG MEPILEX BORDER 4X8 (GAUZE/BANDAGES/DRESSINGS) ×3 IMPLANT
DRSG PAD ABDOMINAL 8X10 ST (GAUZE/BANDAGES/DRESSINGS) ×9 IMPLANT
DURAPREP 26ML APPLICATOR (WOUND CARE) ×6 IMPLANT
ELECT REM PT RETURN 9FT ADLT (ELECTROSURGICAL) ×3
ELECTRODE REM PT RTRN 9FT ADLT (ELECTROSURGICAL) ×1 IMPLANT
EVACUATOR 1/8 PVC DRAIN (DRAIN) IMPLANT
FACESHIELD WRAPAROUND (MASK) ×6 IMPLANT
GAUZE SPONGE 4X4 12PLY STRL (GAUZE/BANDAGES/DRESSINGS) ×3 IMPLANT
GAUZE XEROFORM 5X9 LF (GAUZE/BANDAGES/DRESSINGS) ×3 IMPLANT
GLOVE BIOGEL PI IND STRL 8 (GLOVE) ×2 IMPLANT
GLOVE BIOGEL PI INDICATOR 8 (GLOVE) ×4
GLOVE ORTHO TXT STRL SZ7.5 (GLOVE) ×6 IMPLANT
GOWN STRL REUS W/ TWL LRG LVL3 (GOWN DISPOSABLE) ×1 IMPLANT
GOWN STRL REUS W/ TWL XL LVL3 (GOWN DISPOSABLE) ×1 IMPLANT
GOWN STRL REUS W/TWL 2XL LVL3 (GOWN DISPOSABLE) ×3 IMPLANT
GOWN STRL REUS W/TWL LRG LVL3 (GOWN DISPOSABLE) ×2
GOWN STRL REUS W/TWL XL LVL3 (GOWN DISPOSABLE) ×2
HANDPIECE INTERPULSE COAX TIP (DISPOSABLE) ×2
IMMOBILIZER KNEE 22 UNIV (SOFTGOODS) ×3 IMPLANT
KIT BASIN OR (CUSTOM PROCEDURE TRAY) ×3 IMPLANT
KIT TURNOVER KIT B (KITS) ×3 IMPLANT
MANIFOLD NEPTUNE II (INSTRUMENTS) ×3 IMPLANT
MARKER SKIN DUAL TIP RULER LAB (MISCELLANEOUS) ×3 IMPLANT
NEEDLE 18GX1X1/2 (RX/OR ONLY) (NEEDLE) ×3 IMPLANT
NEEDLE HYPO 25GX1X1/2 BEV (NEEDLE) ×3 IMPLANT
NS IRRIG 1000ML POUR BTL (IV SOLUTION) ×3 IMPLANT
PACK TOTAL JOINT (CUSTOM PROCEDURE TRAY) ×3 IMPLANT
PAD ARMBOARD 7.5X6 YLW CONV (MISCELLANEOUS) ×6 IMPLANT
PAD CAST 4YDX4 CTTN HI CHSV (CAST SUPPLIES) ×1 IMPLANT
PADDING CAST ABS 6INX4YD NS (CAST SUPPLIES) ×2
PADDING CAST ABS COTTON 6X4 NS (CAST SUPPLIES) ×1 IMPLANT
PADDING CAST COTTON 4X4 STRL (CAST SUPPLIES) ×2
PADDING CAST COTTON 6X4 STRL (CAST SUPPLIES) ×3 IMPLANT
PIN DRILL FIX HALF THREAD (BIT) ×3 IMPLANT
PIN STEINMAN FIXATION KNEE (PIN) ×3 IMPLANT
SET HNDPC FAN SPRY TIP SCT (DISPOSABLE) ×1 IMPLANT
STAPLER VISISTAT 35W (STAPLE) IMPLANT
SUCTION FRAZIER HANDLE 10FR (MISCELLANEOUS) ×2
SUCTION TUBE FRAZIER 10FR DISP (MISCELLANEOUS) ×1 IMPLANT
SUT VIC AB 0 CT1 27 (SUTURE) ×4
SUT VIC AB 0 CT1 27XBRD ANBCTR (SUTURE) ×2 IMPLANT
SUT VIC AB 1 CTX 36 (SUTURE) ×4
SUT VIC AB 1 CTX36XBRD ANBCTR (SUTURE) ×2 IMPLANT
SUT VIC AB 2-0 CT1 27 (SUTURE) ×4
SUT VIC AB 2-0 CT1 TAPERPNT 27 (SUTURE) ×2 IMPLANT
SUT VIC AB 3-0 X1 27 (SUTURE) ×3 IMPLANT
SYR 50ML LL SCALE MARK (SYRINGE) ×3 IMPLANT
SYR CONTROL 10ML LL (SYRINGE) ×3 IMPLANT
TIBIAL BASE ROT PLAT SZ 8 KNEE (Knees) ×3 IMPLANT
TOWEL GREEN STERILE (TOWEL DISPOSABLE) ×3 IMPLANT
TOWEL GREEN STERILE FF (TOWEL DISPOSABLE) ×3 IMPLANT
TRAY CATH 16FR W/PLASTIC CATH (SET/KITS/TRAYS/PACK) IMPLANT

## 2020-11-26 NOTE — Transfer of Care (Signed)
Immediate Anesthesia Transfer of Care Note  Patient: Martin Mckee  Procedure(s) Performed: LEFT TOTAL KNEE ARTHROPLASTY-CEMENTED (Left Knee)  Patient Location: PACU  Anesthesia Type:MAC, Regional and Spinal  Level of Consciousness: awake, alert  and oriented  Airway & Oxygen Therapy: Patient Spontanous Breathing  Post-op Assessment: Report given to RN  Post vital signs: Reviewed and stable  Last Vitals:  Vitals Value Taken Time  BP 121/81   Temp    Pulse 74 11/26/20 1743  Resp 14 11/26/20 1743  SpO2 100 % 11/26/20 1743  Vitals shown include unvalidated device data.  Last Pain:  Vitals:   11/26/20 1333  TempSrc:   PainSc: 6          Complications: No complications documented.

## 2020-11-26 NOTE — Progress Notes (Signed)
Patient arrived on the floor from PACU at 1945 via bed. Vital signs taken by PACU RNs and admission assessment will be completed to the best of my ability. Will review post op orders and proceed with POC.

## 2020-11-26 NOTE — Op Note (Signed)
Preop diagnosis: Left knee primary osteoarthritis  Postop diagnosis: Same  Procedure: Right total knee arthroplasty.  Surgeon: Annell Greening, MD  Assistant: Zonia Kief, PA-C medically necessary and present for the entire procedure  Anesthesia: Preoperative abductor block plus spinal +20 cc Exparel +20 cc Marcaine.  Tourniquet: 350 x 55 minutes.  Implants:Depuy size 8 femur and size 8 tibia Attune rotating platform with 5 mm thick polyrotating platform.  38 mm 3 peg dome patella.  Procedure: After preoperative block spinal anesthesia with inability to lift his leg proximal thigh tourniquet heel bump lateral post were applied.  ChloraPrep was used from the tip the toes the tourniquet to the patient's history of Betadine allergy.  Usual extremity sheets drapes and clear plastic Steri-Drape was used without Betadine.  Sterile skin marker been used and timeout procedure was completed.  TXA IV was given 2 g Ancef prophylaxis.  Leg was wrapped in Esmarch midline incision medial parapatellar incision tricompartmental degenerative changes bone-on-bone changes was noted.  Large spurs removed 1.0 to 1.5cm.  Intramedullary hole drilled in the femur 10 mm resected from the femur measured cut was made on the tibia and an additional 2 mm were taken.  5 mm spacer block which fit it was somewhat tight posterior spurs were removed removal of some posterior loose bodies an additional 2 mm were taken on the femur this allowed the 5 mm fit spacer block fit appropriately with full extension and good collateral balance.  Chamfer cuts were made on the femur box cut keel preparation on the tibia some loose body posteriorly removed as well as posterior spurs.  ACL PCL were all completely resected.  Pulsatile lavage back to mixing of the cement and then cementing the tibia femur placed in the polycomponent and then 3 peg patella held with a patellar clamp cement was hardened 15 minutes Exparel was injected while cement was  setting up and tourniquet was still up.  Tourniquet deflated when the cement was hardened 15 minutes hemostasis obtained.  Standard layered closure #1 Vicryl in the fascia retinaculum.  2-0 Vicryl subtendinous tissue skin staple closure postop dressing and knee immobilizer.  Patient tolerated procedure well transferred recovery in stable condition.

## 2020-11-26 NOTE — Interval H&P Note (Signed)
History and Physical Interval Note:  11/26/2020 2:51 PM  Martin Mckee  has presented today for surgery, with the diagnosis of LEFT KNEE OSTEOARTHRITIS.  The various methods of treatment have been discussed with the patient and family. After consideration of risks, benefits and other options for treatment, the patient has consented to  Procedure(s): LEFT TOTAL KNEE ARTHROPLASTY-CEMENTED (Left) as a surgical intervention.  The patient's history has been reviewed, patient examined, no change in status, stable for surgery.  I have reviewed the patient's chart and labs.  Questions were answered to the patient's satisfaction.     Eldred Manges

## 2020-11-26 NOTE — Anesthesia Procedure Notes (Signed)
Spinal  Patient location during procedure: OR Start time: 11/26/2020 3:40 PM End time: 11/26/2020 3:48 PM Staffing Performed: anesthesiologist  Anesthesiologist: Val Eagle, MD Preanesthetic Checklist Completed: patient identified, IV checked, site marked, risks and benefits discussed, surgical consent, monitors and equipment checked, pre-op evaluation and timeout performed Spinal Block Patient position: sitting Prep: DuraPrep Patient monitoring: heart rate, cardiac monitor, continuous pulse ox and blood pressure Approach: midline Location: L3-4 Injection technique: single-shot Needle Needle type: Sprotte  Needle gauge: 24 G Needle length: 9 cm Assessment Sensory level: T4 Additional Notes Attempt at L4/5 with palpable dural pop and no csf return. Moved to L3/4 with again palpable dural pop and minimal csf return. Unable to aspirate consistently. Rather than preform additional dural puncture decision to inject local made. No heme or paresthesias. Spinal set up with adequate level for surgery.

## 2020-11-26 NOTE — H&P (Signed)
TOTAL KNEE ADMISSION H&P  Patient is being admitted for left total knee arthroplasty.  Subjective:  Chief Complaint:left knee pain. 64 year old black male history of end-stage DJD left knee and pain comes in for preop evaluation.  States the left knee symptoms unchanged from previous visit.  He is want to proceed with left total knee replacement as scheduled.  We have received preop medical and cardiac clearance from Earley Abide, PA.  Today history and physical performed.  Blood pressure in clinic 158/109 and rechecked 147/103.   HPI: Martin Mckee, 64 y.o. male, has a history of pain and functional disability in the left knee due to arthritis and has failed non-surgical conservative treatments for greater than 12 weeks to includeNSAID's and/or analgesics, corticosteriod injections, use of assistive devices and activity modification.  Onset of symptoms was gradual, starting 10 years ago with gradually worsening course since that time. The patient noted no past surgery on the left knee(s).  Patient currently rates pain in the left knee(s) at 10 out of 10 with activity. Patient has night pain, worsening of pain with activity and weight bearing, pain that interferes with activities of daily living, pain with passive range of motion, crepitus and joint swelling.  Patient has evidence of subchondral cysts, subchondral sclerosis, periarticular osteophytes and joint space narrowing by imaging studies. There is no active infection.  Patient Active Problem List   Diagnosis Date Noted  . Unilateral primary osteoarthritis, left knee 10/07/2020   Past Medical History:  Diagnosis Date  . Arthritis   . Collapse, lung   . Eczema   . GERD (gastroesophageal reflux disease)   . Gout   . Hypertension     Past Surgical History:  Procedure Laterality Date  . KNEE ARTHROSCOPY    . ROTATOR CUFF REPAIR     x2    Current Facility-Administered Medications  Medication Dose Route Frequency Provider Last Rate  Last Admin  . bupivacaine liposome (EXPAREL) 1.3 % injection 266 mg  20 mL Infiltration To OR Eldred Manges, MD       Current Outpatient Medications  Medication Sig Dispense Refill Last Dose  . allopurinol (ZYLOPRIM) 100 MG tablet Take 200 mg by mouth 3 (three) times daily.     Marland Kitchen amLODipine (NORVASC) 10 MG tablet Take 10 mg by mouth daily.     . clobetasol cream (TEMOVATE) 0.05 % Apply 1 application topically daily. Eczema     . losartan (COZAAR) 100 MG tablet Take 100 mg by mouth daily.     . meloxicam (MOBIC) 7.5 MG tablet Take 7.5 mg by mouth daily as needed (inflammation in Knees).      . metoprolol succinate (TOPROL-XL) 50 MG 24 hr tablet Take 50 mg by mouth daily.     Marland Kitchen olmesartan (BENICAR) 40 MG tablet Take 40 mg by mouth daily.     Marland Kitchen omeprazole (PRILOSEC) 20 MG capsule Take 20 mg by mouth daily.      Allergies  Allergen Reactions  . Bacitracin Other (See Comments)    Positive patch test  . Oxycodone Hcl Nausea Only    Social History   Tobacco Use  . Smoking status: Former Smoker    Years: 10.00  . Smokeless tobacco: Never Used  Substance Use Topics  . Alcohol use: Yes    Comment: occasionally    No family history on file.   Review of Systems  Constitutional: Positive for activity change.  HENT: Negative.   Respiratory: Negative.   Cardiovascular: Negative.  Gastrointestinal: Negative.   Genitourinary:       There is some question as to whether or not he has prostate issues.  I do not see a diagnosis of BPH and not currently on any medication.  I did encourage him to get routine DRE's with his primary care provider who has offered this in the past but patient declined.  We did discuss the risk of prostate issues.  Patient's wife is present and he voices understanding.  Musculoskeletal: Positive for gait problem and joint swelling.  Skin: Negative.     Objective:  Physical Exam  Vital signs in last 24 hours:    Labs:   Estimated body mass index is 32.67  kg/m as calculated from the following:   Height as of 11/22/20: 6\' 1"  (1.854 m).   Weight as of 11/22/20: 112.3 kg.   Imaging Review Plain radiographs demonstrate moderate degenerative joint disease of the left knee(s). The overall alignment ismild varus. The bone quality appears to be good for age and reported activity level.      Assessment/Plan:  End stage arthritis, left knee   The patient history, physical examination, clinical judgment of the provider and imaging studies are consistent with end stage degenerative joint disease of the left knee(s) and total knee arthroplasty is deemed medically necessary. The treatment options including medical management, injection therapy arthroscopy and arthroplasty were discussed at length. The risks and benefits of total knee arthroplasty were presented and reviewed. The risks due to aseptic loosening, infection, stiffness, patella tracking problems, thromboembolic complications and other imponderables were discussed. The patient acknowledged the explanation, agreed to proceed with the plan and consent was signed. Patient is being admitted for inpatient treatment for surgery, pain control, PT, OT, prophylactic antibiotics, VTE prophylaxis, progressive ambulation and ADL's and discharge planning. The patient is planning to be discharged home with home health services    Anticipated LOS equal to or greater than 2 midnights due to - Age 37 and older with one or more of the following:  - Obesity  - Expected need for hospital services (PT, OT, Nursing) required for safe  discharge  - Anticipated need for postoperative skilled nursing care or inpatient rehab  - Active co-morbidities: None OR   - Unanticipated findings during/Post Surgery: None  - Patient is a high risk of re-admission due to: None

## 2020-11-26 NOTE — Anesthesia Procedure Notes (Signed)
Procedure Name: MAC Date/Time: 11/26/2020 4:40 PM Performed by: Griffin Dakin, CRNA Pre-anesthesia Checklist: Patient identified, Suction available, Emergency Drugs available and Patient being monitored Patient Re-evaluated:Patient Re-evaluated prior to induction Oxygen Delivery Method: Simple face mask Induction Type: IV induction Dental Injury: Teeth and Oropharynx as per pre-operative assessment

## 2020-11-26 NOTE — Anesthesia Procedure Notes (Addendum)
Anesthesia Regional Block: Adductor canal block   Pre-Anesthetic Checklist: ,, timeout performed, Correct Patient, Correct Site, Correct Laterality, Correct Procedure,, site marked, risks and benefits discussed, Surgical consent,  Pre-op evaluation,  At surgeon's request and post-op pain management  Laterality: Left  Prep: chloraprep       Needles:  Injection technique: Single-shot  Needle Type: Echogenic Stimulator Needle     Needle Length: 10cm  Needle Gauge: 20     Additional Needles:   Procedures:,,,, ultrasound used (permanent image in chart),,,,  Narrative:  Start time: 11/26/2020 2:00 PM End time: 11/26/2020 2:10 PM Injection made incrementally with aspirations every 5 mL.  Performed by: Personally  Anesthesiologist: Leonides Grills, MD  Additional Notes: Functioning IV was confirmed and monitors were applied. A time-out was performed. Hand hygiene and sterile gloves were used. The thigh was placed in a frog-leg position and prepped in a sterile fashion. A 20ga BBraun echogenic stimulator needle was placed using ultrasound guidance.  Negative aspiration and negative test dose prior to incremental administration of local anesthetic. The patient tolerated the procedure well.

## 2020-11-26 NOTE — Progress Notes (Signed)
Orthopedic Tech Progress Note Patient Details:  Martin Mckee Apr 15, 1956 [id number]  CPM Left Knee CPM Left Knee: On Left Knee Flexion (Degrees): 90 Left Knee Extension (Degrees): 0  Post Interventions Patient Tolerated: Well Instructions Provided: Care of device, Poper ambulation with device  Martin Mckee P [name] 11/26/2020, 8:07 PM

## 2020-11-27 ENCOUNTER — Encounter (HOSPITAL_COMMUNITY): Payer: Self-pay | Admitting: Orthopaedic Surgery

## 2020-11-27 DIAGNOSIS — M1712 Unilateral primary osteoarthritis, left knee: Secondary | ICD-10-CM | POA: Diagnosis not present

## 2020-11-27 LAB — CBC
HCT: 41.4 % (ref 39.0–52.0)
Hemoglobin: 14 g/dL (ref 13.0–17.0)
MCH: 29.4 pg (ref 26.0–34.0)
MCHC: 33.8 g/dL (ref 30.0–36.0)
MCV: 87 fL (ref 80.0–100.0)
Platelets: 249 10*3/uL (ref 150–400)
RBC: 4.76 MIL/uL (ref 4.22–5.81)
RDW: 13.7 % (ref 11.5–15.5)
WBC: 9.2 10*3/uL (ref 4.0–10.5)
nRBC: 0 % (ref 0.0–0.2)

## 2020-11-27 LAB — BASIC METABOLIC PANEL
Anion gap: 9 (ref 5–15)
BUN: 11 mg/dL (ref 8–23)
CO2: 23 mmol/L (ref 22–32)
Calcium: 9.3 mg/dL (ref 8.9–10.3)
Chloride: 102 mmol/L (ref 98–111)
Creatinine, Ser: 1.09 mg/dL (ref 0.61–1.24)
GFR, Estimated: >60 mL/min (ref >60)
Glucose, Bld: 147 mg/dL — ABNORMAL HIGH (ref 70–99)
Potassium: 4.4 mmol/L (ref 3.5–5.1)
Sodium: 134 mmol/L — ABNORMAL LOW (ref 135–145)

## 2020-11-27 MED ORDER — OXYCODONE-ACETAMINOPHEN 5-325 MG PO TABS
1.0000 | ORAL_TABLET | ORAL | 0 refills | Status: AC | PRN
Start: 2020-11-27 — End: 2021-11-27

## 2020-11-27 MED ORDER — ONDANSETRON HCL 4 MG PO TABS: 4.0000 mg | ORAL_TABLET | Freq: Four times a day (QID) | ORAL | 0 refills | Status: AC | PRN

## 2020-11-27 MED ORDER — ONDANSETRON HCL 4 MG PO TABS: 4.0000 mg | ORAL_TABLET | Freq: Four times a day (QID) | ORAL | 0 refills | Status: DC | PRN

## 2020-11-27 MED ORDER — ASPIRIN 325 MG PO TABS: 325.0000 mg | ORAL_TABLET | Freq: Every day | ORAL | Status: AC

## 2020-11-27 MED ORDER — METHOCARBAMOL 500 MG PO TABS: 500.0000 mg | ORAL_TABLET | Freq: Three times a day (TID) | ORAL | 1 refills | Status: DC | PRN

## 2020-11-27 NOTE — TOC Transition Note (Signed)
Transition of Care [facility]) - CM/SW Discharge Note   Patient Details  Name: Martin Mckee MRN: [id number] Date of Birth: [birth year]  Transition of Care [facility]) CM/SW Contact:  Epifanio Lesches, RN Phone Number: 11/27/2020, 5:11 PM   Clinical Narrative:    Late entry: 11/27/2020 1600  Patient will DC to: home Anticipated DC date: 11/27/2020 Family notified: wife Transport by: car   Per MD patient ready for DC today . RN, patient, and patient's wife aware of  of DC.  Order noted noted for HHPT services. Pt agreeable to [facility] services. Choice provided. Pt without preference. Referral made with [facility] and accepted. dme ROLLING WALKER AND 3IN 1/bsc will be delivered to bedside prior to d/c.  Pt without Rx med concerns or affordability.    Final next level of care: Home w Home Health Services Barriers to Discharge: No Barriers Identified   Patient Goals and CMS Choice     Choice offered to / list presented to : Patient  Discharge Placement                       Discharge Plan and Services                DME Arranged: 3-N-1, Walker rolling   Date DME Agency Contacted: 11/27/20 Time DME Agency Contacted: 1544 Representative spoke with at DME Agency: Velna Hatchet HH Arranged: PT HH Agency: [facility] Date [facility] Agency Contacted: 11/27/20 Time HH Agency Contacted: 1551 Representative spoke with at [facility] Agency: [place]  Social Determinants of Health (SDOH) Interventions     Readmission Risk Interventions No flowsheet data found.

## 2020-11-27 NOTE — Progress Notes (Addendum)
Nutrition Brief Note  Patient identified on the Malnutrition Screening Tool (MST) Report  Wt Readings from Last 15 Encounters:  11/26/20 114.3 kg  11/22/20 112.3 kg  11/14/20 114.7 kg  10/04/20 113.4 kg   Martin Mckee, 64 y.o. male, has a history of pain and functional disability in the left knee due to arthritis and has failed non-surgical conservative treatments for greater than 12 weeks to includeNSAID's and/or analgesics, corticosteriod injections, use of assistive devices and activity modification.  Onset of symptoms was gradual.   Pt admitted with lt knee DJD.  12/6- s/p Procedure: Right total knee arthroplasty.  Reviewed I/O's: +700 ml x 24 hours  UOP: 400 ml x 24 hours  Per MD notes, will likely discharge later on today.   Reviewed wt hx; wt has been stable over the past 2 months.   Nutrition-Focused physical exam completed. Findings are no fat depletion, no muscle depletion, and no edema.   Medications reviewed and include colace.   Labs reviewed: Na: 134.   Current diet order is heart healthy, patient is consuming approximately n/a% of meals at this time. Labs and medications reviewed.   No nutrition interventions warranted at this time. If nutrition issues arise, please consult RD.   Levada Schilling, RD, LDN, CDCES Registered Dietitian II Certified Diabetes Care and Education Specialist Please refer to [facility] for RD and/or RD on-call/weekend/after hours pager

## 2020-11-27 NOTE — Discharge Instructions (Signed)

## 2020-11-27 NOTE — Plan of Care (Signed)

## 2020-11-27 NOTE — Evaluation (Signed)
Physical Therapy Evaluation Patient Details Name: Martin Mckee MRN: [id number] DOB: 09-06-1956 Today's Date: 11/27/2020   History of Present Illness  Pt is a 64 y.o. male s/p elective L TKA on 11/26/20. PMH includes HTN, gout, arthritis, rotator cuff repair.  Clinical Impression  Pt presents with an overall decrease in functional mobility secondary to above. PTA, pt independent and lives with supportive wife. Educ on precautions, positioning, therex, and importance of mobility. Today, pt able to initiate transfer and gait training with RW and min guard; session limited by bouts of nausea/vomiting with activity (RN aware). Despite this, pt motivated to participate and moving well. Pt would benefit from continued acute PT services to maximize functional mobility and independence prior to d/c home; suspect pt will be ready for return home after this afternoon's session.     Follow Up Recommendations Home health PT;Supervision for mobility/OOB;Follow surgeon's recommendation for DC plan and follow-up therapies    Equipment Recommendations  Rolling walker with 5" wheels;3in1 (PT) (bariatric-sized)    Recommendations for Other Services       Precautions / Restrictions Precautions Precautions: Fall;Knee Restrictions Weight Bearing Restrictions: Yes LLE Weight Bearing: Weight bearing as tolerated      Mobility  Bed Mobility Overal bed mobility: Needs Assistance Bed Mobility: Supine to Sit     Supine to sit: Min assist     General bed mobility comments: MinA for LLE management to EOB    Transfers Overall transfer level: Needs assistance Equipment used: Rolling walker (2 wheeled) Transfers: Sit to/from Stand Sit to Stand: Min guard         General transfer comment: Close min guard for balance, cues for hand placement with RW; pt with nausea/vomiting upon standing; good eccentric control to sit with recliner armrest support and allowing L knee to  flex  Ambulation/Gait Ambulation/Gait assistance: Min guard Gait Distance (Feet): 40 Feet Assistive device: Rolling walker (2 wheeled) Gait Pattern/deviations: Step-through pattern;Decreased stride length;Decreased weight shift to left;Antalgic Gait velocity: Decreased   General Gait Details: Slow, antalgic gait with RW and min guard for balance; deferred further mobility secondary to nausea/vomiting (RN aware)  Stairs            Wheelchair Mobility    Modified Rankin (Stroke Patients Only)       Balance Overall balance assessment: Needs assistance   Sitting balance-Leahy Scale: Good       Standing balance-Leahy Scale: Fair Standing balance comment: Can static stand and take steps without UE support, although very antalgic gait pattern                             Pertinent Vitals/Pain Pain Assessment: Faces Faces Pain Scale: Hurts even more Pain Location: L knee Pain Descriptors / Indicators: Discomfort;Grimacing;Guarding Pain Intervention(s): Monitored during session;Patient requesting pain meds-RN notified;Limited activity within patient's tolerance    Home Living Family/patient expects to be discharged to:: Private residence Living Arrangements: Spouse/significant other Available Help at Discharge: Family;Available 24 hours/day Type of Home: House Home Access: Stairs to enter   [employer] of Steps: 1 Home Layout: One level Home Equipment: Shower seat      Prior Function Level of Independence: Independent               Hand Dominance        Extremity/Trunk Assessment   Upper Extremity Assessment Upper Extremity Assessment: Overall WFL for tasks assessed    Lower Extremity Assessment Lower Extremity Assessment: LLE  deficits/detail LLE Deficits / Details: s/p L TKA limited by post-op pain and weakness; able to perform LAQ, difficulty with supine/seated hip flex LLE: Unable to fully assess due to pain    Cervical /  Trunk Assessment Cervical / Trunk Assessment: Normal  Communication   Communication: No difficulties  Cognition Arousal/Alertness: Awake/alert Behavior During Therapy: WFL for tasks assessed/performed Overall Cognitive Status: Within Functional Limits for tasks assessed                                        General Comments General comments (skin integrity, edema, etc.): Wife present and supportive. Pt with multiple bouts of nausea/vomiting but still highly motivated to participate and d/c home today    Exercises Total Joint Exercises Long Arc Quad: AROM;Left;Seated   Assessment/Plan    PT Assessment Patient needs continued PT services  PT Problem List Decreased strength;Decreased range of motion;Decreased activity tolerance;Decreased balance;Decreased mobility;Decreased knowledge of use of DME;Decreased knowledge of precautions;Pain       PT Treatment Interventions DME instruction;Gait training;Stair training;Therapeutic activities;Functional mobility training;Therapeutic exercise;Balance training;Patient/family education    PT Goals (Current goals can be found in the Care Plan section)  Acute Rehab PT Goals Patient Stated Goal: Return home today PT Goal Formulation: With patient/family Time For Goal Achievement: 12/11/20 Potential to Achieve Goals: Good    Frequency 7X/week   Barriers to discharge        Co-evaluation               AM-PAC PT "6 Clicks" Mobility  Outcome Measure Help needed turning from your back to your side while in a flat bed without using bedrails?: A Little Help needed moving from lying on your back to sitting on the side of a flat bed without using bedrails?: A Little Help needed moving to and from a bed to a chair (including a wheelchair)?: A Little Help needed standing up from a chair using your arms (e.g., wheelchair or bedside chair)?: A Little Help needed to walk in hospital room?: A Little Help needed climbing 3-5  steps with a railing? : A Little 6 Click Score: 18    End of Session Equipment Utilized During Treatment: Gait belt Activity Tolerance: Patient tolerated treatment well;Treatment limited secondary to medical complications (Comment) Patient left: in chair;with call bell/phone within reach;with family/visitor present;with nursing/sitter in room Nurse Communication: Mobility status PT Visit Diagnosis: Other abnormalities of gait and mobility (R26.89);Pain Pain - Right/Left: Left Pain - part of body: Knee    Time: 0840-0905 PT Time Calculation (min) (ACUTE ONLY): 25 min   Charges:   PT Evaluation $PT Eval Low Complexity: 1 Low PT Treatments $Therapeutic Activity: 8-22 mins       Martin Mckee, PT, DPT Acute Rehabilitation Services  Pager [phone] Office [phone]  Malachy Chamber 11/27/2020, 9:36 AM

## 2020-11-27 NOTE — Progress Notes (Signed)
   Subjective: 1 Day Post-Op Procedure(s) (LRB): LEFT TOTAL KNEE ARTHROPLASTY-CEMENTED (Left) Patient reports pain as mild and moderate.    Objective: Vital signs in last 24 hours: Temp:  [97.1 F (36.2 C)-98.7 F (37.1 C)] 98.3 F (36.8 C) (12/07 0439) Pulse Rate:  [59-97] 93 (12/07 0439) Resp:  [14-18] 17 (12/07 0439) BP: (99-138)/(73-112) 104/82 (12/07 0439) SpO2:  [93 %-100 %] 98 % (12/07 0439) Weight:  [114.3 kg] 114.3 kg (12/06 1322)  Intake/Output from previous day: 12/06 0701 - 12/07 0700 In: 1200 [I.V.:1200] Out: 500 [Urine:400; Blood:100] Intake/Output this shift: No intake/output data recorded.  Recent Labs    11/27/20 0403  HGB 14.0   Recent Labs    11/27/20 0403  WBC 9.2  RBC 4.76  HCT 41.4  PLT 249   Recent Labs    11/27/20 0403  NA 134*  K 4.4  CL 102  CO2 23  BUN 11  CREATININE 1.09  GLUCOSE 147*  CALCIUM 9.3   No results for input(s): LABPT, INR in the last 72 hours.  Neurologically intact DG Knee 1-2 Views Left  Result Date: 11/26/2020 CLINICAL DATA:  Postop check. EXAM: LEFT KNEE - 1-2 VIEW COMPARISON:  Radiograph 06/11/2016 FINDINGS: Patient has undergone left total knee arthroplasty. The hardware is well positioned. There is no evidence of acute fracture or dislocation. A small amount of gas is present within the joint and soft tissue surrounding the knee. Anterior skin staples are in place. IMPRESSION: No demonstrated complication following left total knee arthroplasty. Electronically Signed   By: Carey Bullocks M.D.   On: 11/26/2020 18:20    Assessment/Plan: 1 Day Post-Op Procedure(s) (LRB): LEFT TOTAL KNEE ARTHROPLASTY-CEMENTED (Left) Up with therapy. Already sitting at bedside with knee at 80 degrees flexion. Crosses ankle and swings leg back on to bed. Plan therapy and home this afternoon.   Eldred Manges 11/27/2020, 8:12 AM

## 2020-11-27 NOTE — Progress Notes (Signed)
Physical Therapy Treatment Patient Details Name: Martin Mckee MRN: [id number] DOB: September 24, 1956 Today's Date: 11/27/2020    History of Present Illness Pt is a 64 y.o. male s/p elective L TKA on 11/26/20. PMH includes HTN, gout, arthritis, rotator cuff repair.   PT Comments    Pt progressing well with mobility. Able to tolerate additional transfer and gait training with RW, as well as stair training. Reviewed educ re: precautions, positioning, DVT prevention, therex/ROM (HEP handout provided and practiced) and safety. Wife present and supportive. Pt preparing for d/c home this afternoon; moving well and will have necessary assist from family.   Follow Up Recommendations  Home health PT;Supervision for mobility/OOB;Follow surgeon's recommendation for DC plan and follow-up therapies     Equipment Recommendations  Rolling walker with 5" wheels;3in1 (PT) (bariatric-sized)    Recommendations for Other Services       Precautions / Restrictions Precautions Precautions: Fall;Knee Precaution Comments: Reviewed knee precautions Restrictions Weight Bearing Restrictions: Yes LLE Weight Bearing: Weight bearing as tolerated    Mobility  Bed Mobility Overal bed mobility: Modified Independent Bed Mobility: Supine to Sit;Sit to Supine           General bed mobility comments: Mod indep with use of gait belt to assist LLE into/out of bed  Transfers Overall transfer level: Needs assistance Equipment used: Rolling walker (2 wheeled) Transfers: Sit to/from Stand Sit to Stand: Supervision         General transfer comment: Multiple sit<>stands to RW from low bed height with supervision for safety, reliant on momentum; good eccentric control with sitting and improving tolerance to L knee flexion  Ambulation/Gait Ambulation/Gait assistance: Min guard;Supervision Gait Distance (Feet): 160 Feet Assistive device: Rolling walker (2 wheeled) Gait Pattern/deviations: Step-through  pattern;Decreased stride length;Decreased weight shift to left;Antalgic;Trunk flexed Gait velocity: Decreased   General Gait Details: Slow, antalgic gait with RW and intermittent min guard for balance; cues for heel-to-toe gait pattern, maintaining closer proximity to RW and achieving fully upright posture   Stairs Stairs: Yes Stairs assistance: Min guard Stair Management: One rail Left;Step to pattern;Sideways Number of Stairs: 3 General stair comments: Able to ascend/descend 3 steps with BUE support on single rail, min guard for balance; wife present for education on technique and guarding   Wheelchair Mobility    Modified Rankin (Stroke Patients Only)       Balance Overall balance assessment: Needs assistance   Sitting balance-Leahy Scale: Good       Standing balance-Leahy Scale: Fair Standing balance comment: Can static stand and take steps without UE support, although very antalgic gait pattern                            Cognition Arousal/Alertness: Awake/alert Behavior During Therapy: WFL for tasks assessed/performed Overall Cognitive Status: Within Functional Limits for tasks assessed                                        Exercises Other Exercises Other Exercises: [employer] HEP handout (Access Code [email or web address]) provided  and practiced - seated LAQ, seated gastroc stretch with strap, supine heel slides with strap    General Comments General comments (skin integrity, edema, etc.): Wife present and supportive. Pt preparing for d/c today - reviewed educ re: precautions, positioning, DVT prevention, therex (HEP handout provided), LB ADL strategies      Pertinent Vitals/Pain Pain  Assessment: Faces Faces Pain Scale: Hurts little more Pain Location: L knee Pain Descriptors / Indicators: Discomfort;Grimacing;Guarding Pain Intervention(s): Monitored during session;Premedicated before session    Home Living                       Prior Function            PT Goals (current goals can now be found in the care plan section) Progress towards PT goals: Progressing toward goals    Frequency    7X/week      PT Plan Current plan remains appropriate    Co-evaluation              AM-PAC PT "6 Clicks" Mobility   Outcome Measure  Help needed turning from your back to your side while in a flat bed without using bedrails?: None Help needed moving from lying on your back to sitting on the side of a flat bed without using bedrails?: None Help needed moving to and from a bed to a chair (including a wheelchair)?: A Little Help needed standing up from a chair using your arms (e.g., wheelchair or bedside chair)?: A Little Help needed to walk in hospital room?: A Little Help needed climbing 3-5 steps with a railing? : A Little 6 Click Score: 20    End of Session Equipment Utilized During Treatment: Gait belt Activity Tolerance: Patient tolerated treatment well Patient left: in bed;with call bell/phone within reach;with family/visitor present Nurse Communication: Mobility status PT Visit Diagnosis: Other abnormalities of gait and mobility (R26.89);Pain Pain - Right/Left: Left Pain - part of body: Knee     Time: 7253-6644 PT Time Calculation (min) (ACUTE ONLY): 30 min  Charges:  $Gait Training: 8-22 mins $Therapeutic Activity: 8-22 mins                    Ina Homes, PT, DPT Acute Rehabilitation Services  Pager [phone] Office [phone]  Martin Mckee 11/27/2020, 2:16 PM

## 2020-11-28 NOTE — Anesthesia Postprocedure Evaluation (Signed)
Anesthesia Post Note  Patient: Martin Mckee  Procedure(s) Performed: LEFT TOTAL KNEE ARTHROPLASTY-CEMENTED (Left Knee)     Patient location during evaluation: PACU Anesthesia Type: Regional and General Level of consciousness: awake and alert Pain management: pain level controlled Vital Signs Assessment: post-procedure vital signs reviewed and stable Respiratory status: spontaneous breathing, nonlabored ventilation, respiratory function stable and patient connected to nasal cannula oxygen Cardiovascular status: blood pressure returned to baseline and stable Postop Assessment: no apparent nausea or vomiting Anesthetic complications: no   No complications documented.  Last Vitals:  Vitals:   11/27/20 0439 11/27/20 0814  BP: 104/82 (!) 140/99  Pulse: 93 (!) 102  Resp: 17 18  Temp: 36.8 C 36.7 C  SpO2: 98% 97%    Last Pain:  Vitals:   11/27/20 0906  TempSrc:   PainSc: 6                  Dorathy Stallone

## 2020-12-03 ENCOUNTER — Other Ambulatory Visit: Payer: Self-pay | Admitting: Orthopaedic Surgery

## 2020-12-03 ENCOUNTER — Encounter: Payer: Self-pay | Admitting: Orthopaedic Surgery

## 2020-12-03 MED ORDER — OXYCODONE-ACETAMINOPHEN 5-325 MG PO TABS
1.0000 | ORAL_TABLET | Freq: Four times a day (QID) | ORAL | 0 refills | Status: DC | PRN
Start: 2020-12-03 — End: 2020-12-10

## 2020-12-03 NOTE — Progress Notes (Signed)
I called , discussed backing down , having to take stool softener since taking it around the clock , etc.   # 40 tabs sent in . Has appt Thursday.

## 2020-12-03 NOTE — Discharge Summary (Signed)
Patient ID: Martin Mckee MRN: [id number] DOB/AGE: 07-24-1956 64 y.o.  Admit date: 11/26/2020 Discharge date: 11/27/2020  Admission Diagnoses:  Active Problems:   Arthritis of left knee   Discharge Diagnoses:  Active Problems:   Arthritis of left knee  status post Procedure(s): LEFT TOTAL KNEE ARTHROPLASTY-CEMENTED  Past Medical History:  Diagnosis Date  . Arthritis   . Collapse, lung   . Eczema   . GERD (gastroesophageal reflux disease)   . Gout   . Hypertension     Surgeries: Procedure(s): LEFT TOTAL KNEE ARTHROPLASTY-CEMENTED on 11/26/2020   Consultants:   Discharged Condition: Improved  Hospital Course: Martin Mckee is an 64 y.o. male who was admitted 11/26/2020 for operative treatment of left knee djd. Patient failed conservative treatments (please see the history and physical for the specifics) and had severe unremitting pain that affects sleep, daily activities and work/hobbies. After pre-op clearance, the patient was taken to the operating room on 11/26/2020 and underwent  Procedure(s): LEFT TOTAL KNEE ARTHROPLASTY-CEMENTED.    Patient was given perioperative antibiotics:  Anti-infectives (From admission, onward)   Start     Dose/Rate Route Frequency Ordered Stop   11/27/20 0600  ceFAZolin (ANCEF) IVPB 2g/100 mL premix        2 g 200 mL/hr over 30 Minutes Intravenous On call to O.R. 11/26/20 1308 11/26/20 1552       Patient was given sequential compression devices and early ambulation to prevent DVT.   Patient benefited maximally from hospital stay and there were no complications. At the time of discharge, the patient was urinating/moving their bowels without difficulty, tolerating a regular diet, pain is controlled with oral pain medications and they have been cleared by PT/OT.   Recent vital signs: No data found.   Recent laboratory studies: No results for input(s): WBC, HGB, HCT, PLT, NA, K, CL, CO2, BUN, CREATININE, GLUCOSE, INR, CALCIUM in the last 72  hours.  Invalid input(s): PT, 2   Discharge Medications:   Allergies as of 11/27/2020      Reactions   Bacitracin Other (See Comments)   Positive patch test   Latex Other (See Comments)   Per patient "burns skin"   Oxycodone Hcl Nausea Only      Medication List    STOP taking these medications   meloxicam 7.5 MG tablet Commonly known as: MOBIC     TAKE these medications   allopurinol 100 MG tablet Commonly known as: ZYLOPRIM Take 200 mg by mouth 3 (three) times daily.   amLODipine 10 MG tablet Commonly known as: NORVASC Take 10 mg by mouth daily.   aspirin 325 MG tablet Commonly known as: Bayer Aspirin Take 1 tablet (325 mg total) by mouth daily. Take one a day for one month then OK to stop   clobetasol cream 0.05 % Commonly known as: TEMOVATE Apply 1 application topically daily. Eczema   losartan 100 MG tablet Commonly known as: COZAAR Take 100 mg by mouth daily.   methocarbamol 500 MG tablet Commonly known as: Robaxin Take 1 tablet (500 mg total) by mouth every 8 (eight) hours as needed for muscle spasms.   metoprolol succinate 50 MG 24 hr tablet Commonly known as: TOPROL-XL Take 50 mg by mouth daily.   olmesartan 40 MG tablet Commonly known as: BENICAR Take 40 mg by mouth daily.   omeprazole 20 MG capsule Commonly known as: PRILOSEC Take 20 mg by mouth daily.   ondansetron 4 MG tablet Commonly known as: ZOFRAN Take 1 tablet (4  mg total) by mouth every 6 (six) hours as needed for nausea.   oxyCODONE-acetaminophen 5-325 MG tablet Commonly known as: Percocet Take 1-2 tablets by mouth every 4 (four) hours as needed for severe pain.       Diagnostic Studies: DG Knee 1-2 Views Left  Result Date: 11/26/2020 CLINICAL DATA:  Postop check. EXAM: LEFT KNEE - 1-2 VIEW COMPARISON:  Radiograph 06/11/2016 FINDINGS: Patient has undergone left total knee arthroplasty. The hardware is well positioned. There is no evidence of acute fracture or dislocation. A  small amount of gas is present within the joint and soft tissue surrounding the knee. Anterior skin staples are in place. IMPRESSION: No demonstrated complication following left total knee arthroplasty. Electronically Signed   By: Carey Bullocks M.D.   On: 11/26/2020 18:20       Follow-up Information    Eldred Manges, MD Follow up in 2 week(s).   Specialty: Orthopedic Surgery Why: may followup at [facility] clinic in 1 to 2 wks. call if you do not already have an appointment Contact information: [address] [place] [address] [phone]        [facility] Follow up.   Specialty: Home Health Services Why: home health services arranged with [facility]     Contact information: [address] [place]  [address] [phone]               Discharge Plan:  discharge to   Disposition:     Signed: Zonia Kief  12/03/2020, 12:01 PM

## 2020-12-06 ENCOUNTER — Encounter: Payer: Self-pay | Admitting: Orthopaedic Surgery

## 2020-12-06 ENCOUNTER — Other Ambulatory Visit: Payer: Self-pay

## 2020-12-06 ENCOUNTER — Ambulatory Visit (INDEPENDENT_AMBULATORY_CARE_PROVIDER_SITE_OTHER): Payer: Medicare HMO

## 2020-12-06 ENCOUNTER — Ambulatory Visit (INDEPENDENT_AMBULATORY_CARE_PROVIDER_SITE_OTHER): Payer: Medicare HMO | Admitting: Orthopaedic Surgery

## 2020-12-06 VITALS — BP 132/91 | HR 118 | Ht 73.0 in | Wt 252.0 lb

## 2020-12-06 DIAGNOSIS — Z96652 Presence of left artificial knee joint: Secondary | ICD-10-CM

## 2020-12-06 NOTE — Progress Notes (Signed)
   Post-Op Visit Note   Patient: Martin Mckee           Date of Birth: 04-24-1956           MRN: [id number] Visit Date: 12/06/2020 PCP: Patient, No Pcp Per   Assessment & Plan: Post left total knee arthroplasty.  Chief Complaint:  Chief Complaint  Patient presents with  . Left Knee - Routine Post Op    11/26/2020 Left TKA   Visit Diagnoses:  1. Status post total left knee replacement     Plan: ROV 4wks  Follow-Up Instructions: No follow-ups on file.   Orders:  Orders Placed This Encounter  Procedures  . XR Knee 1-2 Views Left   No orders of the defined types were placed in this encounter.   Imaging: No results found.  PMFS History: Patient Active Problem List   Diagnosis Date Noted  . Arthritis of left knee 11/26/2020  . Unilateral primary osteoarthritis, left knee 10/07/2020   Past Medical History:  Diagnosis Date  . Arthritis   . Collapse, lung   . Eczema   . GERD (gastroesophageal reflux disease)   . Gout   . Hypertension     No family history on file.  Past Surgical History:  Procedure Laterality Date  . KNEE ARTHROSCOPY    . ROTATOR CUFF REPAIR     x2  . TOTAL KNEE ARTHROPLASTY Left 11/26/2020   Procedure: LEFT TOTAL KNEE ARTHROPLASTY-CEMENTED;  Surgeon: Eldred Manges, MD;  Location: [facility] OR;  Service: Orthopedics;  Laterality: Left;   Social History   Occupational History  . Not on file  Tobacco Use  . Smoking status: Former Smoker    Years: 10.00  . Smokeless tobacco: Never Used  Substance and Sexual Activity  . Alcohol use: Yes    Comment: occasionally  . Drug use: Not on file  . Sexual activity: Not on file

## 2020-12-10 ENCOUNTER — Encounter: Payer: Self-pay | Admitting: Orthopaedic Surgery

## 2020-12-10 ENCOUNTER — Other Ambulatory Visit: Payer: Self-pay | Admitting: Orthopaedic Surgery

## 2020-12-10 MED ORDER — OXYCODONE-ACETAMINOPHEN 5-325 MG PO TABS: 1.0000 | ORAL_TABLET | Freq: Four times a day (QID) | ORAL | 0 refills | Status: AC | PRN

## 2020-12-19 ENCOUNTER — Other Ambulatory Visit: Payer: Self-pay | Admitting: Orthopaedic Surgery

## 2020-12-19 ENCOUNTER — Encounter: Payer: Self-pay | Admitting: Orthopaedic Surgery

## 2020-12-19 MED ORDER — HYDROCODONE-ACETAMINOPHEN 5-325 MG PO TABS: 1.0000 | ORAL_TABLET | ORAL | 0 refills | Status: DC | PRN

## 2020-12-19 NOTE — Progress Notes (Signed)
I called. 130 percocet so far. Weaned to norco 5/325 one po q 4 hrs prn pain. Doing well with therapy

## 2021-01-03 ENCOUNTER — Ambulatory Visit (INDEPENDENT_AMBULATORY_CARE_PROVIDER_SITE_OTHER): Payer: Medicare HMO | Admitting: Orthopaedic Surgery

## 2021-01-03 ENCOUNTER — Other Ambulatory Visit: Payer: Self-pay

## 2021-01-03 ENCOUNTER — Encounter: Payer: Self-pay | Admitting: Orthopaedic Surgery

## 2021-01-03 VITALS — BP 139/91 | HR 108 | Ht 73.0 in | Wt 244.0 lb

## 2021-01-03 DIAGNOSIS — Z96652 Presence of left artificial knee joint: Secondary | ICD-10-CM

## 2021-01-03 MED ORDER — IBUPROFEN 800 MG PO TABS
800.0000 mg | ORAL_TABLET | Freq: Two times a day (BID) | ORAL | 1 refills | Status: DC | PRN
Start: 2021-01-03 — End: 2021-02-14

## 2021-01-03 NOTE — Progress Notes (Signed)
° °  Post-Op Visit Note   Patient: Martin Mckee           Date of Birth: Feb 12, 1956           MRN: [id number] Visit Date: 01/03/2021 PCP: Patient, No Pcp Per   Assessment & Plan: Post left total knee arthroplasty.  He is got slight extension lag.  His flexion is about 120.  Incision looks good trace effusion.  He still has quad weakness is amatory with a cane having some back pain some problems with his opposite arthritic knee.  Continue therapy.  We discussed working hard on the last bit of extension plus quad strengthening.  Recheck 4 weeks.  Chief Complaint:  Chief Complaint  Patient presents with   Left Knee - Follow-up    11/26/2020 Left TKA   Visit Diagnoses:  1. S/P total knee arthroplasty, left     Plan: Continue therapy continue extension and quad strengthening recheck 4 weeks.  Follow-Up Instructions: Return in about 4 weeks (around 01/31/2021).   Orders:  No orders of the defined types were placed in this encounter.  Meds ordered this encounter  Medications   ibuprofen (ADVIL) 800 MG tablet    Sig: Take 1 tablet (800 mg total) by mouth every 12 (twelve) hours as needed.    Dispense:  60 tablet    Refill:  1    Imaging: No results found.  PMFS History: Patient Active Problem List   Diagnosis Date Noted   S/P total knee arthroplasty, left 12/06/2020   Past Medical History:  Diagnosis Date   Arthritis    Collapse, lung    Eczema    GERD (gastroesophageal reflux disease)    Gout    Hypertension     No family history on file.  Past Surgical History:  Procedure Laterality Date   KNEE ARTHROSCOPY     ROTATOR CUFF REPAIR     x2   TOTAL KNEE ARTHROPLASTY Left 11/26/2020   Procedure: LEFT TOTAL KNEE ARTHROPLASTY-CEMENTED;  Surgeon: Eldred Manges, MD;  Location: [facility] OR;  Service: Orthopedics;  Laterality: Left;   Social History   Occupational History   Not on file  Tobacco Use   Smoking status: Former Smoker    Years: 10.00   Smokeless  tobacco: Never Used  Substance and Sexual Activity   Alcohol use: Yes    Comment: occasionally   Drug use: Not on file   Sexual activity: Not on file

## 2021-02-14 ENCOUNTER — Other Ambulatory Visit: Payer: Self-pay

## 2021-02-14 ENCOUNTER — Encounter: Payer: Self-pay | Admitting: Orthopaedic Surgery

## 2021-02-14 ENCOUNTER — Ambulatory Visit (INDEPENDENT_AMBULATORY_CARE_PROVIDER_SITE_OTHER): Payer: Medicare HMO | Admitting: Orthopaedic Surgery

## 2021-02-14 VITALS — Ht 73.0 in | Wt 245.0 lb

## 2021-02-14 DIAGNOSIS — M1711 Unilateral primary osteoarthritis, right knee: Secondary | ICD-10-CM | POA: Insufficient documentation

## 2021-02-14 DIAGNOSIS — Z96652 Presence of left artificial knee joint: Secondary | ICD-10-CM

## 2021-02-14 MED ORDER — METHOCARBAMOL 500 MG PO TABS: 500.0000 mg | ORAL_TABLET | Freq: Three times a day (TID) | ORAL | 1 refills | Status: AC | PRN

## 2021-02-14 MED ORDER — IBUPROFEN 800 MG PO TABS: 800.0000 mg | ORAL_TABLET | Freq: Two times a day (BID) | ORAL | 1 refills | Status: AC | PRN

## 2021-02-14 NOTE — Progress Notes (Signed)
   Post-Op Visit Note   Patient: Martin Mckee           Date of Birth: 05/08/1956           MRN: [id number] Visit Date: 02/14/2021 PCP: Patient, No Pcp Per   Assessment & Plan: Post left total knee arthroplasty.  He is walking without a limp he now has full extension.  He has good quad strength.  Trace warmth to his knee.  Opposite knee has significant osteoarthritis and we will check him back again in 6 months.  He is happy with the results of left total knee.  He requested a refill of ibuprofen 800 mg and also Robaxin which we sent in.  Chief Complaint:  Chief Complaint  Patient presents with  . Left Knee - Follow-up    11/26/2020 Left TKA   Visit Diagnoses:  1. S/P total knee arthroplasty, left   2. Unilateral primary osteoarthritis, right knee     Plan: Return in 6 months.  Activities as discussed.  He understands that the slight increased warmth and mild effusion will improve with time.  Follow-Up Instructions: Return in about 6 months (around 08/14/2021).   Orders:  No orders of the defined types were placed in this encounter.  Meds ordered this encounter  Medications  . methocarbamol (ROBAXIN) 500 MG tablet    Sig: Take 1 tablet (500 mg total) by mouth every 8 (eight) hours as needed for muscle spasms.    Dispense:  40 tablet    Refill:  1  . ibuprofen (ADVIL) 800 MG tablet    Sig: Take 1 tablet (800 mg total) by mouth every 12 (twelve) hours as needed.    Dispense:  60 tablet    Refill:  1    Imaging: No results found.  PMFS History: Patient Active Problem List   Diagnosis Date Noted  . Unilateral primary osteoarthritis, right knee 02/14/2021  . S/P total knee arthroplasty, left 12/06/2020   Past Medical History:  Diagnosis Date  . Arthritis   . Collapse, lung   . Eczema   . GERD (gastroesophageal reflux disease)   . Gout   . Hypertension     No family history on file.  Past Surgical History:  Procedure Laterality Date  . KNEE ARTHROSCOPY    .  ROTATOR CUFF REPAIR     x2  . TOTAL KNEE ARTHROPLASTY Left 11/26/2020   Procedure: LEFT TOTAL KNEE ARTHROPLASTY-CEMENTED;  Surgeon: Eldred Manges, MD;  Location: [facility] OR;  Service: Orthopedics;  Laterality: Left;   Social History   Occupational History  . Not on file  Tobacco Use  . Smoking status: Former Smoker    Years: 10.00  . Smokeless tobacco: Never Used  Substance and Sexual Activity  . Alcohol use: Yes    Comment: occasionally  . Drug use: Not on file  . Sexual activity: Not on file

## 2021-08-15 ENCOUNTER — Ambulatory Visit: Payer: Medicare HMO | Admitting: Orthopaedic Surgery

## 2021-08-15 ENCOUNTER — Other Ambulatory Visit: Payer: Self-pay

## 2021-08-22 ENCOUNTER — Encounter: Payer: Self-pay | Admitting: Orthopaedic Surgery

## 2021-08-22 ENCOUNTER — Ambulatory Visit (INDEPENDENT_AMBULATORY_CARE_PROVIDER_SITE_OTHER): Payer: Medicare HMO | Admitting: Orthopaedic Surgery

## 2021-08-22 ENCOUNTER — Other Ambulatory Visit: Payer: Self-pay

## 2021-08-22 VITALS — Ht 73.0 in | Wt 250.0 lb

## 2021-08-22 DIAGNOSIS — M6281 Muscle weakness (generalized): Secondary | ICD-10-CM | POA: Diagnosis not present

## 2021-08-22 DIAGNOSIS — M1711 Unilateral primary osteoarthritis, right knee: Secondary | ICD-10-CM

## 2021-08-22 DIAGNOSIS — Z96652 Presence of left artificial knee joint: Secondary | ICD-10-CM | POA: Diagnosis not present

## 2021-08-22 NOTE — Progress Notes (Signed)
Office Visit Note   Patient: Martin Mckee           Date of Birth: 1956-07-06           MRN: [id number] Visit Date: 08/22/2021              Requested by: No referring provider defined for this encounter. PCP: Patient, No Pcp Per (Inactive)   Assessment & Plan: Visit Diagnoses:  1. S/P total knee arthroplasty, left   2. Unilateral primary osteoarthritis, right knee   3. Quadriceps weakness     Plan: We went over again quad exercising for the left knee with straight leg raising using a book bag, wall squats, knee extension exercises single stance with right leg toe touches.  Recheck 3 months.  Follow-Up Instructions: Return in about 3 months (around 11/21/2021).   Orders:  No orders of the defined types were placed in this encounter.  No orders of the defined types were placed in this encounter.     Procedures: No procedures performed   Clinical Data: No additional findings.   Subjective: Chief Complaint  Patient presents with   Left Knee - Follow-up    11/26/2020 left TKA    HPI 9 months post left total knee arthroplasty.  He has right knee osteoarthritis notices his right knee seems a little bit shorter has some varus in his right knee.  Still has quad weakness with his left knee.  Has excellent flexion full extension but still has a hop step when he gets up and doing single leg weightbearing position knee extensions he still has problems.  He had been thinking about his right knee arthroplasty but wants to wait until his left knee is stronger and less symptomatic.  Review of Systems updated unchanged.   Objective: Vital Signs: Ht 6\' 1"  (1.854 m)   Wt 250 lb (113.4 kg)   BMI 32.98 kg/m   Physical Exam Constitutional:      Appearance: He is well-developed.  HENT:     Head: Normocephalic and atraumatic.     Right Ear: External ear normal.     Left Ear: External ear normal.  Eyes:     Pupils: Pupils are equal, round, and reactive to light.  Neck:      Thyroid: No thyromegaly.     Trachea: No tracheal deviation.  Cardiovascular:     Rate and Rhythm: Normal rate.  Pulmonary:     Effort: Pulmonary effort is normal.     Breath sounds: No wheezing.  Abdominal:     General: Bowel sounds are normal.     Palpations: Abdomen is soft.  Musculoskeletal:     Cervical back: Neck supple.  Skin:    General: Skin is warm and dry.     Capillary Refill: Capillary refill takes less than 2 seconds.  Neurological:     Mental Status: He is alert and oriented to person, place, and time.  Psychiatric:        Behavior: Behavior normal.        Thought Content: Thought content normal.        Judgment: Judgment normal.    Ortho Exam left quad weakness with resisted hand pressure at the ankle with straight leg raising.  Still flexes with the hips and hops when he goes up with his left foot first on the step in the exam room.  Negative logroll the hips no swelling no effusion today.  Right knee crepitus mild varus.  Specialty Comments:  No  specialty comments available.  Imaging: No results found.   PMFS History: Patient Active Problem List   Diagnosis Date Noted   Quadriceps weakness 08/22/2021   Unilateral primary osteoarthritis, right knee 02/14/2021   S/P total knee arthroplasty, left 12/06/2020   Past Medical History:  Diagnosis Date   Arthritis    Collapse, lung    Eczema    GERD (gastroesophageal reflux disease)    Gout    Hypertension     No family history on file.  Past Surgical History:  Procedure Laterality Date   KNEE ARTHROSCOPY     ROTATOR CUFF REPAIR     x2   TOTAL KNEE ARTHROPLASTY Left 11/26/2020   Procedure: LEFT TOTAL KNEE ARTHROPLASTY-CEMENTED;  Surgeon: Eldred Manges, MD;  Location: [facility] OR;  Service: Orthopedics;  Laterality: Left;   Social History   Occupational History   Not on file  Tobacco Use   Smoking status: Former    Years: 10.00    Types: Cigarettes   Smokeless tobacco: Never  Substance and Sexual  Activity   Alcohol use: Yes    Comment: occasionally   Drug use: Not on file   Sexual activity: Not on file

## 2021-11-07 ENCOUNTER — Encounter: Payer: Self-pay | Admitting: Orthopaedic Surgery

## 2021-11-07 ENCOUNTER — Ambulatory Visit (INDEPENDENT_AMBULATORY_CARE_PROVIDER_SITE_OTHER): Payer: Medicare HMO | Admitting: Orthopaedic Surgery

## 2021-11-07 ENCOUNTER — Ambulatory Visit: Payer: Self-pay

## 2021-11-07 ENCOUNTER — Other Ambulatory Visit: Payer: Self-pay

## 2021-11-07 VITALS — Ht 73.0 in | Wt 250.0 lb

## 2021-11-07 DIAGNOSIS — M25562 Pain in left knee: Secondary | ICD-10-CM

## 2021-11-07 DIAGNOSIS — G8929 Other chronic pain: Secondary | ICD-10-CM

## 2021-11-07 DIAGNOSIS — M48062 Spinal stenosis, lumbar region with neurogenic claudication: Secondary | ICD-10-CM | POA: Diagnosis not present

## 2021-11-07 DIAGNOSIS — M545 Low back pain, unspecified: Secondary | ICD-10-CM

## 2021-11-07 DIAGNOSIS — M4807 Spinal stenosis, lumbosacral region: Secondary | ICD-10-CM

## 2021-11-07 MED ORDER — GABAPENTIN 100 MG PO CAPS: 100.0000 mg | ORAL_CAPSULE | Freq: Three times a day (TID) | ORAL | 1 refills | Status: AC

## 2021-11-07 NOTE — Progress Notes (Signed)
Office Visit Note   Patient: Martin Mckee           Date of Birth: 28-Nov-1956           MRN: [id number] Visit Date: 11/07/2021              Requested by: No referring provider defined for this encounter. PCP: Patient, No Pcp Per (Inactive)   Assessment & Plan: Visit Diagnoses:  1. Acute bilateral low back pain, unspecified whether sciatica present   2. Chronic pain of left knee   3. Spinal stenosis of lumbosacral region   4. Spinal stenosis of lumbar region with neurogenic claudication     Plan: We will set patient up for MRI scan for evaluation of progressive stenosis at the L4-5 level.  Office follow-up after MRI scan.  Follow-Up Instructions: No follow-ups on file.   Orders:  Orders Placed This Encounter  Procedures   XR Knee 1-2 Views Left   XR Lumbar Spine 2-3 Views   MR Lumbar Spine w/o contrast   Meds ordered this encounter  Medications   gabapentin (NEURONTIN) 100 MG capsule    Sig: Take 1 capsule (100 mg total) by mouth 3 (three) times daily.    Dispense:  90 capsule    Refill:  1      Procedures: No procedures performed   Clinical Data: No additional findings.   Subjective: Chief Complaint  Patient presents with   Lower Back - Pain   Left Knee - Pain, Follow-up    11/26/2020 Left TKA    HPI 65 year old male returns post left total knee arthroplasty 11/26/2020.  His process was slow with quad weakness but has improved.  He is use meloxicam occasionally.  He continues to have some back pain and left buttocks pain that radiates down with numbness and tingling in his toes.  More left and rarely right leg symptoms.  When he tries to exercise he feels like he is having increased back and leg pain.  He has had previous imaging studies that showed L4-5 moderate spinal stenosis 13 years ago in 2010.  Patient's had gradual progressive symptoms but states his severe knee arthritis prior to his total knee that limited some of his ambulation and now that knee pain  is better he is having claudication symptoms with standing or walking.  Review of Systems all other systems updated noncontributory to HPI   Objective: Vital Signs: Ht 6\' 1"  (1.854 m)   Wt 250 lb (113.4 kg)   BMI 32.98 kg/m   Physical Exam Constitutional:      Appearance: He is well-developed.  HENT:     Head: Normocephalic and atraumatic.     Right Ear: External ear normal.     Left Ear: External ear normal.  Eyes:     Pupils: Pupils are equal, round, and reactive to light.  Neck:     Thyroid: No thyromegaly.     Trachea: No tracheal deviation.  Cardiovascular:     Rate and Rhythm: Normal rate.  Pulmonary:     Effort: Pulmonary effort is normal.     Breath sounds: No wheezing.  Abdominal:     General: Bowel sounds are normal.     Palpations: Abdomen is soft.  Musculoskeletal:     Cervical back: Neck supple.  Skin:    General: Skin is warm and dry.     Capillary Refill: Capillary refill takes less than 2 seconds.  Neurological:     Mental Status: He is  alert and oriented to person, place, and time.  Psychiatric:        Behavior: Behavior normal.        Thought Content: Thought content normal.        Judgment: Judgment normal.    Ortho Exam well-healed knee incision knee has full extension he still has slight quad weakness on the left leg.  Distal pulse palpable.  Anterior tib gastrocsoleus is strong.  Specialty Comments:  No specialty comments available.  Imaging: No results found.   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  Past Surgical History:  Procedure  Laterality Date   KNEE ARTHROSCOPY     ROTATOR CUFF REPAIR     x2   TOTAL KNEE ARTHROPLASTY Left 11/26/2020   Procedure: LEFT TOTAL KNEE ARTHROPLASTY-CEMENTED;  Surgeon: Eldred Manges, MD;  Location: [facility] OR;  Service: Orthopedics;  Laterality: Left;   Social History   Occupational History   Not on file  Tobacco Use   Smoking status: Former    Years: 10.00    Types: Cigarettes   Smokeless tobacco: Never  Substance and Sexual Activity   Alcohol use: Yes    Comment: occasionally   Drug use: Not on file   Sexual activity: Not on file

## 2021-11-10 DIAGNOSIS — M48061 Spinal stenosis, lumbar region without neurogenic claudication: Secondary | ICD-10-CM | POA: Insufficient documentation

## 2021-12-25 ENCOUNTER — Ambulatory Visit (HOSPITAL_COMMUNITY): Admission: RE | Admit: 2021-12-25 | Payer: Medicare HMO | Source: Ambulatory Visit

## 2022-03-10 ENCOUNTER — Telehealth: Payer: Self-pay | Admitting: Orthopaedic Surgery

## 2022-03-10 NOTE — Telephone Encounter (Signed)
I called and spoke with patient's wife. He was scheduled for MRI at [facility], however, felt his insurance deductible was too high and canceled the scan. This was in January.  She states pain has worsened and she would like to schedule scan at [facility] now. Can you tell me if authorization and order are still good, or does this need to be re-entered and processed? I went over info that I have on insurance, and patient's wife pulled card, states this is correct, there have been no changes. ? ?She would like to be called to make appt, etc. ?Please let me know what I need to do. ?Thanks. ?

## 2022-03-10 NOTE — Telephone Encounter (Signed)
Erie Noe ( pts wife) is calling she needs the MRI approved again and scheduled the insurance has been corrected  ?

## 2022-03-28 ENCOUNTER — Ambulatory Visit (HOSPITAL_COMMUNITY)
Admission: RE | Admit: 2022-03-28 | Discharge: 2022-03-28 | Disposition: A | Discharge status: Home / Self Care | Payer: Medicare PPO | Source: Ambulatory Visit | Attending: Orthopaedic Surgery | Admitting: Orthopaedic Surgery

## 2022-03-28 DIAGNOSIS — M4807 Spinal stenosis, lumbosacral region: Secondary | ICD-10-CM | POA: Insufficient documentation

## 2022-03-28 IMAGING — MR MR LUMBAR SPINE W/O CM
4 of 5 series · 30 of 48 positions shown · non-contrast
Comparison: Lumbar spine radiographs [DATE]. MRI lumbar spine
[DATE]

CLINICAL DATA: Spinal stenosis

EXAM:
MRI LUMBAR SPINE WITHOUT CONTRAST
TECHNIQUE: Multiplanar, multisequence MR imaging of the lumbar spine was
performed. No intravenous contrast was administered.

[Series 5: T1 · sagittal · 4.0mm · 0.81mm/px · 6 of 17 slices shown (1 of 2)]
[im 1/17]
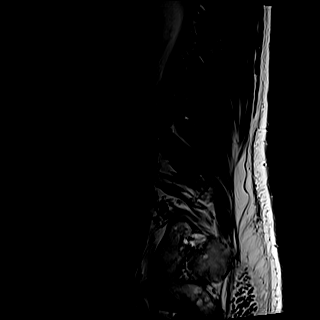
[im 4/17]
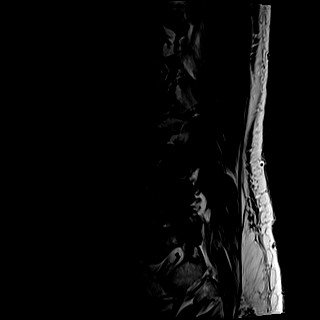
[im 7/17]
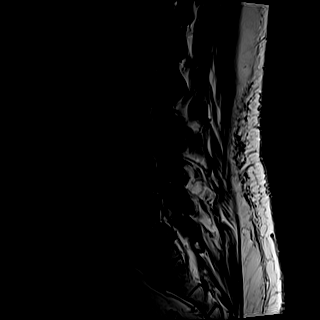
[im 10/17]
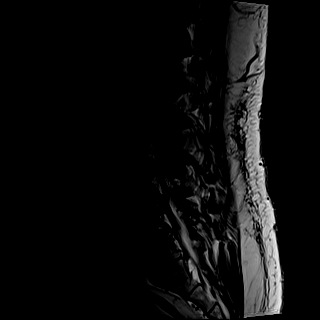
[im 13/17]
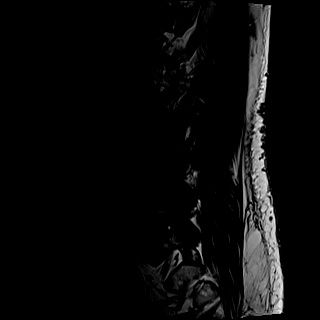
[im 17/17]
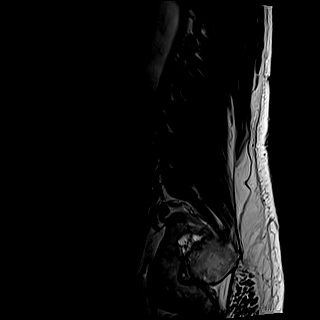

[Series 6: T2 · sagittal · 4.0mm · 0.81mm/px · 6 of 17 slices shown (1 of 2)]
[im 1/17]
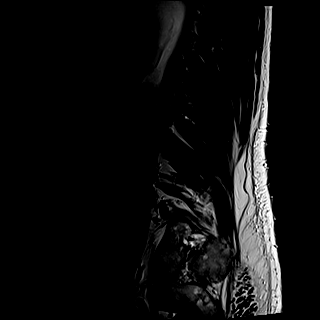
[im 4/17]
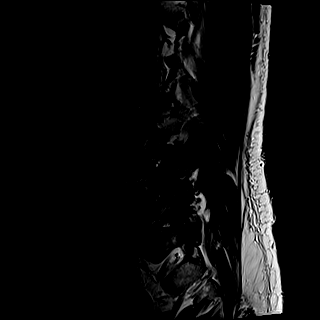
[im 7/17]
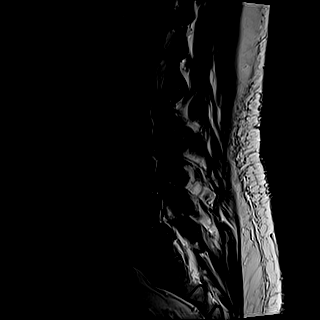
[im 10/17]
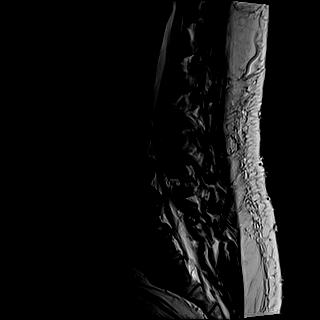
[im 13/17]
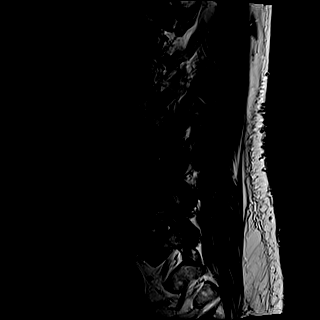
[im 17/17]
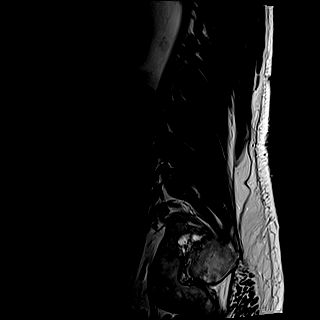

[Series 8: T2 · axial · 4.0mm · 0.62mm/px · z∈[-92,+105]mm · 9 of 41 slices shown (2 of 2)]
[im 1/41]
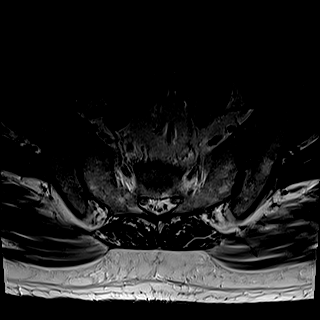
[im 6/41]
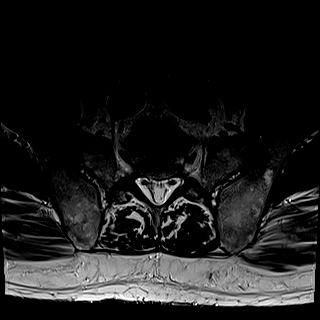
[im 12/41]
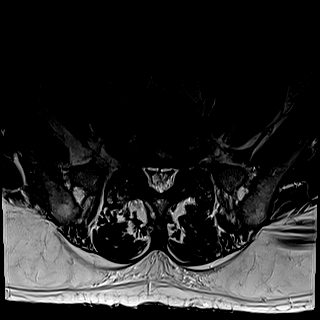
[im 18/41]
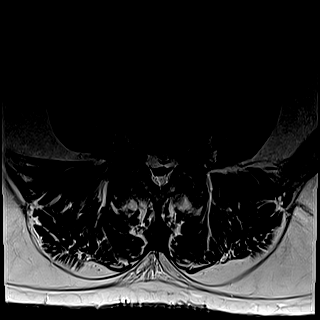
[im 21/41]
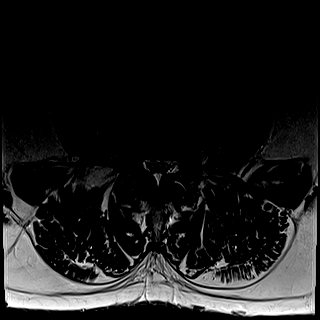
[im 23/41]
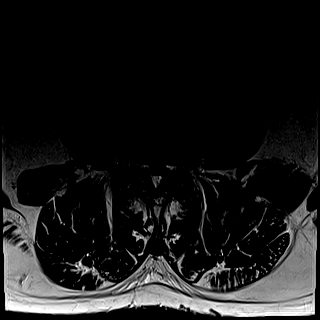
[im 29/41]
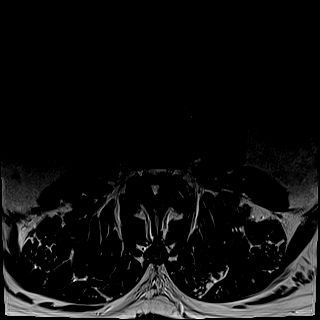
[im 35/41]
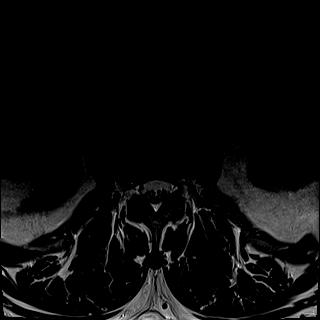
[im 41/41]
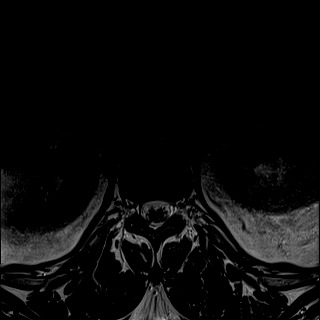

[Series 9: T1 · axial · 4.0mm · 0.39mm/px · z∈[-92,+105]mm · 9 of 41 slices shown (2 of 2)]
[im 1/41]
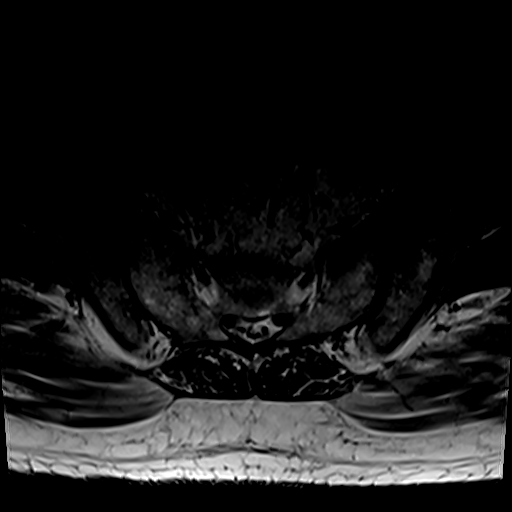
[im 6/41]
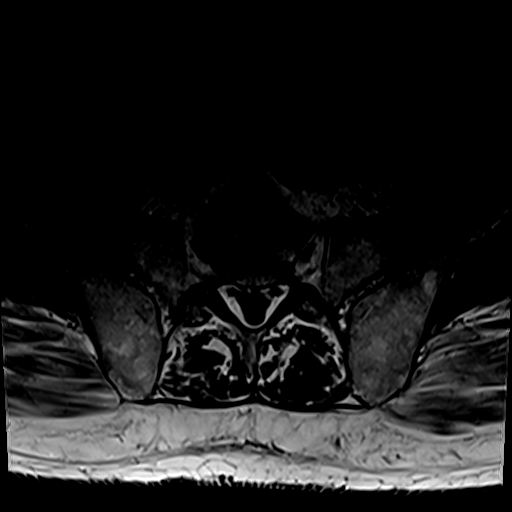
[im 12/41]
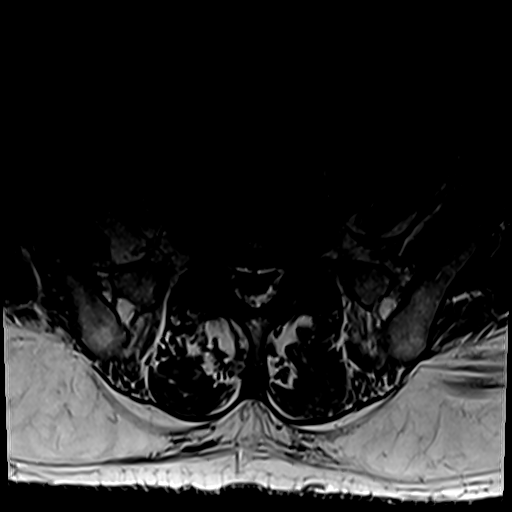
[im 18/41]
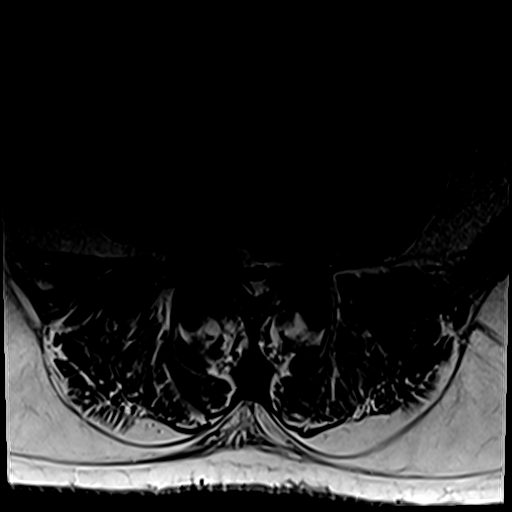
[im 21/41]
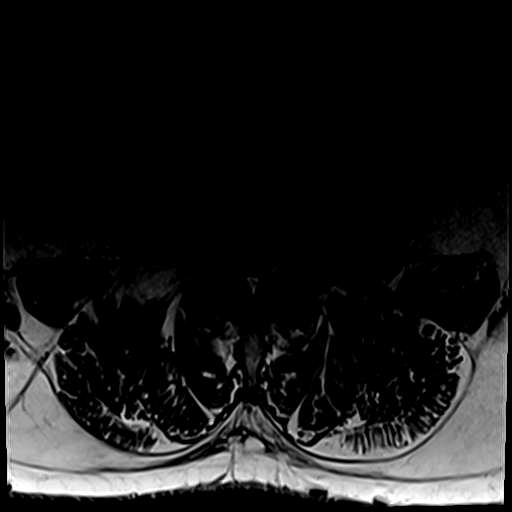
[im 23/41]
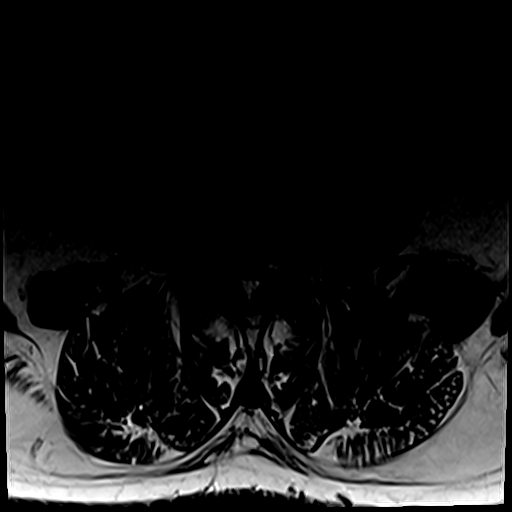
[im 29/41]
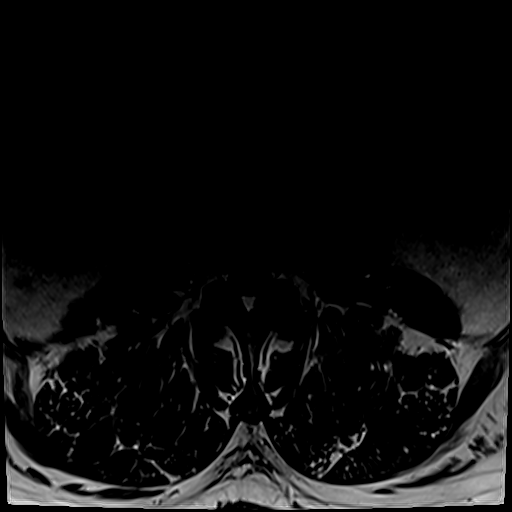
[im 35/41]
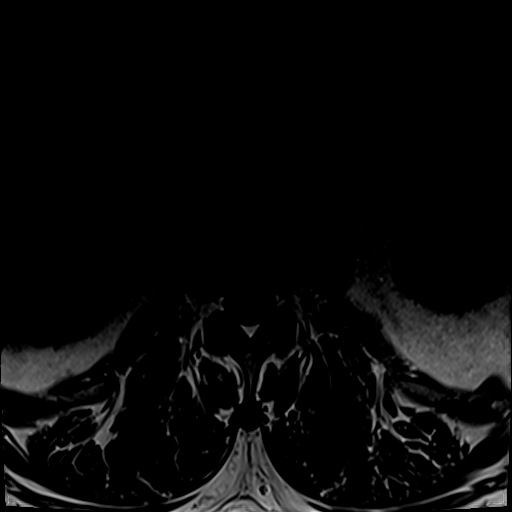
[im 41/41]
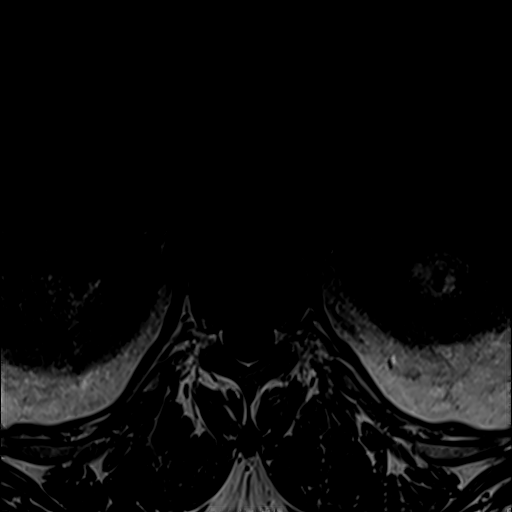

[30 of 48 positions shown; findings below may reference images not displayed]

FINDINGS: Segmentation:  Transitional S1 segment is again noted.

Alignment: Grade 1 anterolisthesis at L5-S1 is similar the prior
exam. Slight anterolisthesis at L2-3, L3-4 and L4-5 has progressed.
Progressive curvature is centered at the right at L2 and on the left
at L4-5.

Vertebrae: Progressive chronic fatty endplate marrow changes are
present at L3-4 L4-5 and L5-S1.

Conus medullaris and cauda equina: Conus extends to the L1 level.
Conus and cauda equina appear normal.

Paraspinal and other soft tissues: A right renal cyst is
incompletely imaged. Visualized abdomen is otherwise unremarkable.

Disc levels:

L1-2: Negative.

L2-3: Mild disc bulge and moderate facet hypertrophy noted. Facet
disease has progressed. No significant stenosis is present.

L3-4: A broad-based disc protrusion is present. Progressive moderate
facet hypertrophy is noted. Crowding of the central nerve roots
present with moderate central and bilateral foraminal narrowing,
progressed.

L4-5: Broad-based disc protrusion is asymmetric to the left.
Progressive moderate facet hypertrophy noted bilaterally. Prominent
epidural fat is noted. Crowding the nerve roots noted centrally.
Moderate to severe foraminal narrowing has progressed bilaterally.

L5-S1: A broad-based disc protrusion is present. Progressive
moderate facet hypertrophy noted. Prominent epidural fat is present.
Mild subarticular narrowing is present bilaterally. Moderate
bilateral foraminal stenosis is present.

L5-S1: No focal stenosis.
IMPRESSION: 1. Progressive multilevel spondylosis of the lumbar spine as
described.
2. Progressive moderate central and bilateral foraminal narrowing at
L3-4.
3. Moderate to severe central and bilateral foraminal narrowing at
L4-5 has progressed. This is the most severe level.
4. Progressive mild subarticular and moderate bilateral foraminal
stenosis at L5-S1.
5. Progressive facet hypertrophy at L2-3 without significant
stenosis.
6. Progressive scoliosis.
7. Transitional S1 segment.

## 2022-04-03 ENCOUNTER — Encounter: Payer: Self-pay | Admitting: Orthopaedic Surgery

## 2022-04-03 ENCOUNTER — Ambulatory Visit (INDEPENDENT_AMBULATORY_CARE_PROVIDER_SITE_OTHER): Payer: Medicare PPO | Admitting: Orthopaedic Surgery

## 2022-04-03 ENCOUNTER — Ambulatory Visit (INDEPENDENT_AMBULATORY_CARE_PROVIDER_SITE_OTHER): Payer: Medicare PPO

## 2022-04-03 VITALS — Ht 73.0 in | Wt 250.0 lb

## 2022-04-03 DIAGNOSIS — R29898 Other symptoms and signs involving the musculoskeletal system: Secondary | ICD-10-CM

## 2022-04-03 DIAGNOSIS — M542 Cervicalgia: Secondary | ICD-10-CM

## 2022-04-03 DIAGNOSIS — G5621 Lesion of ulnar nerve, right upper limb: Secondary | ICD-10-CM

## 2022-04-03 NOTE — Progress Notes (Signed)
? ?Office Visit Note ?  ?Patient: Martin Mckee           ?Date of Birth: 09/03/1956           ?MRN: [id number] ?Visit Date: 04/03/2022 ?             ?Requested by: No referring provider defined for this encounter. ?PCP: Babs BertinNasrat, Safwat, MD ? ? ?Assessment & Plan: ?Visit Diagnoses:  ?1. Right arm weakness   ?2. Cervicalgia   ?3. Cubital tunnel syndrome, right   ? ? ?Plan: Patient with new triceps weakness abnormal gait C7 weakness and hand weakness.  He may have cubital tunnel in conjunction with some cervical stenosis.  Would recommend EMG nerve conduction velocities to rule out cubital tunnel and also MRI scan cervical spine for is abnormal gait and C7 weakness on the right side only.  Follow-up after studies. ? ?Follow-Up Instructions: No follow-ups on file.  ? ?Orders:  ?Orders Placed This Encounter  ?Procedures  ? XR Cervical Spine 2 or 3 views  ? MR Cervical Spine w/o contrast  ? Ambulatory referral to Physical Medicine Rehab  ? ?No orders of the defined types were placed in this encounter. ? ? ? ? Procedures: ?No procedures performed ? ? ?Clinical Data: ?No additional findings. ? ? ?Subjective: ?Chief Complaint  ?Patient presents with  ? Lower Back - Pain, Follow-up  ?  MRI review  ? Right Arm - Weakness  ? ? ?HPI 66 year old male returns post lumbar MRI scan.  Patient states he continues to have problems with bilateral foot numbness and with claudication symptoms.  Sexually had more problems with new weakness in his right arm and notices that he cannot push with his right arm.  Numbness and tingling with decreased grip strength has come on in the last month.  Noticed that he only has 50% cervical range of motion and then has sharp pain.  He is right-hand dominant and states he actually can squeeze better with the left and right hand and feels that he is lost 50% grip strength on the right.  Does have a history of gout hypertension.  He has been on some Neurontin for his lower back and he does have progressive  spondylosis with moderate central biforaminal stenosis at L3-4 and moderate to severe stenosis at L4-5.  His wife states he is has been walking over the last 2 months with progressively wider stance gait and his balance has been poor. ? ?Review of Systems 14 point update unchanged from 08/22/2021 office visit other than as mentioned in HPI. ? ? ?Objective: ?Vital Signs: Ht 6\' 1"  (1.854 m)   Wt 250 lb (113.4 kg)   BMI 32.98 kg/m?  ? ?Physical Exam ?Constitutional:   ?   Appearance: He is well-developed.  ?HENT:  ?   Head: Normocephalic and atraumatic.  ?   Right Ear: External ear normal.  ?   Left Ear: External ear normal.  ?Eyes:  ?   Pupils: Pupils are equal, round, and reactive to light.  ?Neck:  ?   Thyroid: No thyromegaly.  ?   Trachea: No tracheal deviation.  ?Cardiovascular:  ?   Rate and Rhythm: Normal rate.  ?Pulmonary:  ?   Effort: Pulmonary effort is normal.  ?   Breath sounds: No wheezing.  ?Abdominal:  ?   General: Bowel sounds are normal.  ?   Palpations: Abdomen is soft.  ?Musculoskeletal:  ?   Cervical back: Neck supple.  ?Skin: ?   General:  Skin is warm and dry.  ?   Capillary Refill: Capillary refill takes less than 2 seconds.  ?Neurological:  ?   Mental Status: He is alert and oriented to person, place, and time.  ?Psychiatric:     ?   Behavior: Behavior normal.     ?   Thought Content: Thought content normal.     ?   Judgment: Judgment normal.  ? ? ?Ortho Exam patient improvement in his quad strength on the left.  Well-healed knee incision good flexion full extension.  Negative logroll of the hips.  3+ lower extremity reflexes.  He has absent triceps reflex on the right and has less than 50% triceps resisted strength testing on the right with weak wrist flexion.  Finger extension is normal he has interosseous weakness positive Tinel's at the elbow but no subluxation of the ulnar nerve on the right.  All motor or sensory testing on the left upper extremity is normal with normal strength.  Biceps on  the right and wrist extension is normal. ? ?Specialty Comments:  ?No specialty comments available. ? ?Imaging: ?Narrative & Impression  ?CLINICAL DATA:  Spinal stenosis ?  ?EXAM: ?MRI LUMBAR SPINE WITHOUT CONTRAST ?  ?TECHNIQUE: ?Multiplanar, multisequence MR imaging of the lumbar spine was ?performed. No intravenous contrast was administered. ?  ?COMPARISON:  Lumbar spine radiographs 06/11/2016. MRI lumbar spine ?03/26/2009 ?  ?FINDINGS: ?Segmentation:  Transitional S1 segment is again noted. ?  ?Alignment: Grade 1 anterolisthesis at L5-S1 is similar the prior ?exam. Slight anterolisthesis at L2-3, L3-4 and L4-5 has progressed. ?Progressive curvature is centered at the right at L2 and on the left ?at L4-5. ?  ?Vertebrae: Progressive chronic fatty endplate marrow changes are ?present at L3-4 L4-5 and L5-S1. ?  ?Conus medullaris and cauda equina: Conus extends to the L1 level. ?Conus and cauda equina appear normal. ?  ?Paraspinal and other soft tissues: A right renal cyst is ?incompletely imaged. Visualized abdomen is otherwise unremarkable. ?  ?Disc levels: ?  ?L1-2: Negative. ?  ?L2-3: Mild disc bulge and moderate facet hypertrophy noted. Facet ?disease has progressed. No significant stenosis is present. ?  ?L3-4: A broad-based disc protrusion is present. Progressive moderate ?facet hypertrophy is noted. Crowding of the central nerve roots ?present with moderate central and bilateral foraminal narrowing, ?progressed. ?  ?L4-5: Broad-based disc protrusion is asymmetric to the left. ?Progressive moderate facet hypertrophy noted bilaterally. Prominent ?epidural fat is noted. Crowding the nerve roots noted centrally. ?Moderate to severe foraminal narrowing has progressed bilaterally. ?  ?L5-S1: A broad-based disc protrusion is present. Progressive ?moderate facet hypertrophy noted. Prominent epidural fat is present. ?Mild subarticular narrowing is present bilaterally. Moderate ?bilateral foraminal stenosis is  present. ?  ?L5-S1: No focal stenosis. ?  ?IMPRESSION: ?1. Progressive multilevel spondylosis of the lumbar spine as ?described. ?2. Progressive moderate central and bilateral foraminal narrowing at ?L3-4. ?3. Moderate to severe central and bilateral foraminal narrowing at ?L4-5 has progressed. This is the most severe level. ?4. Progressive mild subarticular and moderate bilateral foraminal ?stenosis at L5-S1. ?5. Progressive facet hypertrophy at L2-3 without significant ?stenosis. ?6. Progressive scoliosis. ?7. Transitional S1 segment. ?  ?  ?Electronically Signed ?  By: Marin Roberts M.D. ?  On: 04/01/2022 11:05 ?   ? ? ? ?PMFS History: ?Patient Active Problem List  ? Diagnosis Date Noted  ? Spinal stenosis of lumbar region 11/10/2021  ? Quadriceps weakness 08/22/2021  ? Unilateral primary osteoarthritis, right knee 02/14/2021  ? S/P total knee  arthroplasty, left 12/06/2020  ? ?Past Medical History:  ?Diagnosis Date  ? Arthritis   ? Collapse, lung   ? Eczema   ? GERD (gastroesophageal reflux disease)   ? Gout   ? Hypertension   ?  ?No family history on file.  ?Past Surgical History:  ?Procedure Laterality Date  ? KNEE ARTHROSCOPY    ? ROTATOR CUFF REPAIR    ? x2  ? TOTAL KNEE ARTHROPLASTY Left 11/26/2020  ? Procedure: LEFT TOTAL KNEE ARTHROPLASTY-CEMENTED;  Surgeon: Eldred Manges, MD;  Location[facility] OR;  Service: Orthopedics;  Laterality: Left;  ? ?Social History  ? ?Occupational History  ? Not on file  ?Tobacco Use  ? Smoking status: Former  ?  Years: 10.00  ?  Types: Cigarettes  ? Smokeless tobacco: Never  ?Substance and Sexual Activity  ? Alcohol use: Yes  ?  Comment: occasionally  ? Drug use: Not on file  ? Sexual activity: Not on file  ? ? ? ? ? ? ?

## 2022-04-12 ENCOUNTER — Ambulatory Visit (HOSPITAL_COMMUNITY)
Admission: RE | Admit: 2022-04-12 | Discharge: 2022-04-12 | Disposition: A | Discharge status: Home / Self Care | Payer: Medicare PPO | Source: Ambulatory Visit | Attending: Orthopaedic Surgery | Admitting: Orthopaedic Surgery

## 2022-04-12 DIAGNOSIS — R29898 Other symptoms and signs involving the musculoskeletal system: Secondary | ICD-10-CM | POA: Insufficient documentation

## 2022-04-12 DIAGNOSIS — M542 Cervicalgia: Secondary | ICD-10-CM | POA: Insufficient documentation

## 2022-04-12 IMAGING — MR MR CERVICAL SPINE W/O CM
5 of 6 series · 33 of 48 positions shown · non-contrast
Comparison: None.

CLINICAL DATA: Neck pain, chronic right C7 weakness, C8 weakness

EXAM:
MRI CERVICAL SPINE WITHOUT CONTRAST
TECHNIQUE: Multiplanar, multisequence MR imaging of the cervical spine was
performed. No intravenous contrast was administered.

[Series 5: T1 · sagittal · 3.0mm · 0.69mm/px · 5 of 15 slices shown (1 of 2)]
[im 1/15]
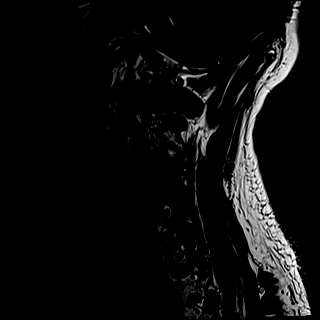
[im 4/15]
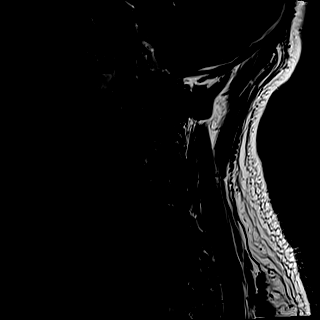
[im 8/15]
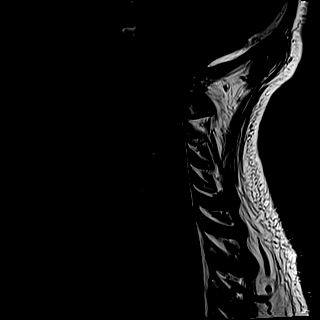
[im 11/15]
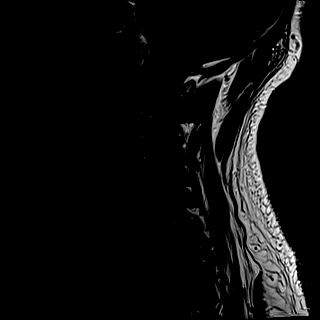
[im 15/15]
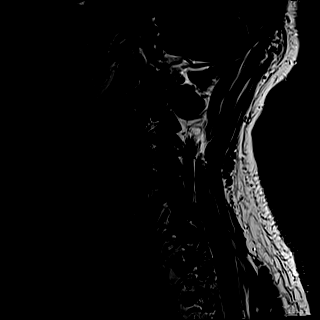

[Series 6: T2 · sagittal · 3.0mm · 0.69mm/px · 5 of 15 slices shown (1 of 2)]
[im 1/15]
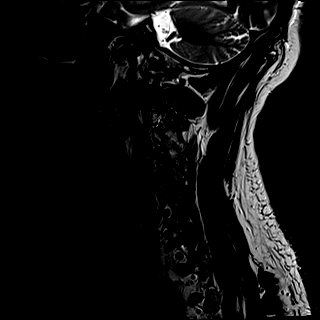
[im 4/15]
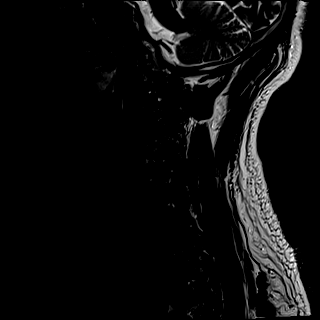
[im 8/15]
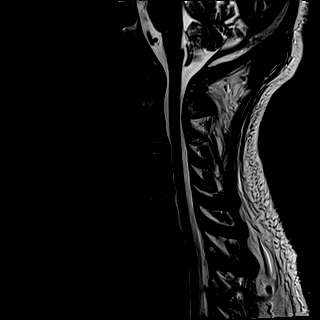
[im 11/15]
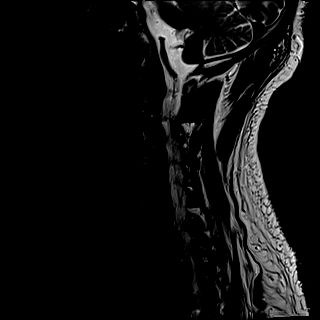
[im 15/15]
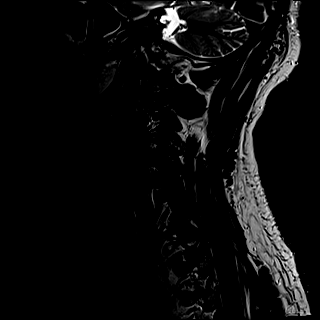

[Series 7: STIR · sagittal · 3.0mm · 0.86mm/px · 1 of 15 slices shown]
[im 1/15]
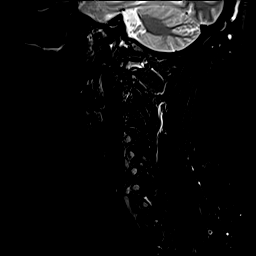

[Series 8: T2 · axial · 3.0mm · 0.70mm/px · z∈[-59,+36]mm · 11 of 30 slices shown (2 of 2)]
[im 1/30]
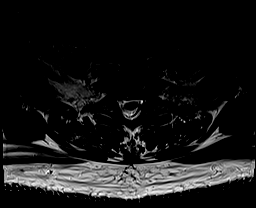
[im 3/30]
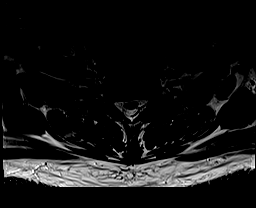
[im 6/30]
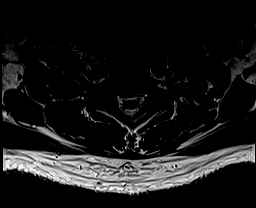
[im 9/30]
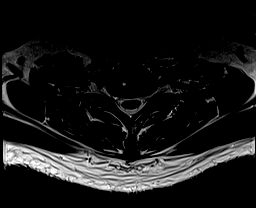
[im 12/30]
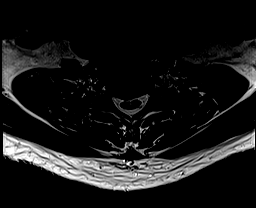
[im 15/30]
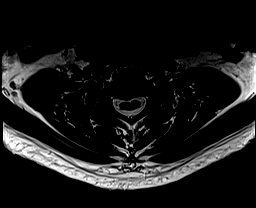
[im 18/30]
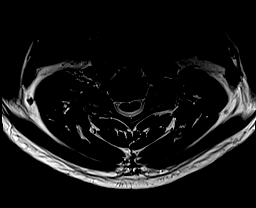
[im 21/30]
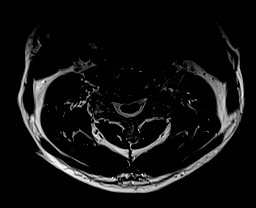
[im 24/30]
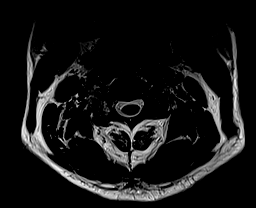
[im 27/30]
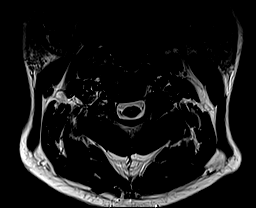
[im 30/30]
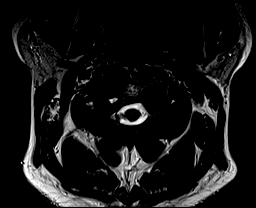

[Series 10: T1 · axial · 3.0mm · 0.35mm/px · z∈[-66,+30]mm · 11 of 30 slices shown (2 of 2)]
[im 1/30]
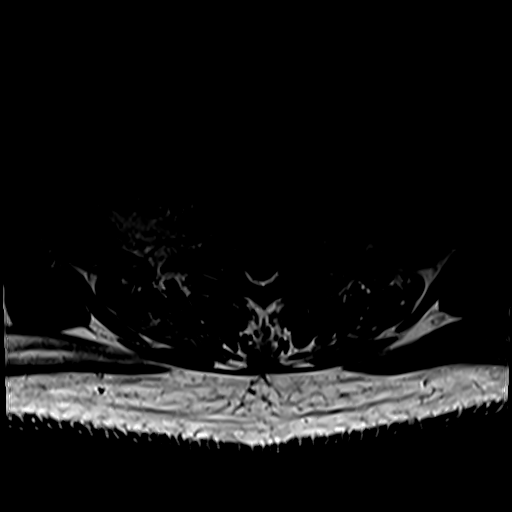
[im 3/30]
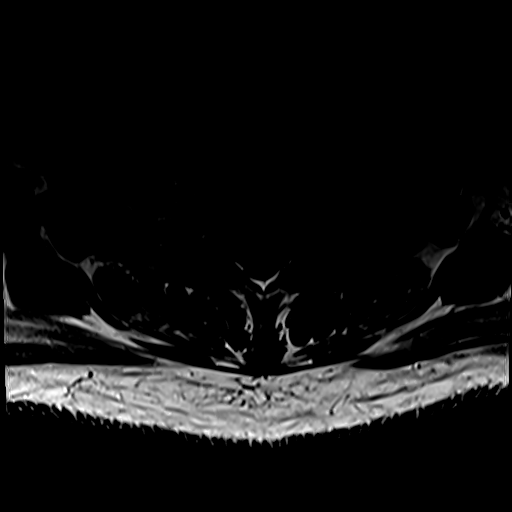
[im 6/30]
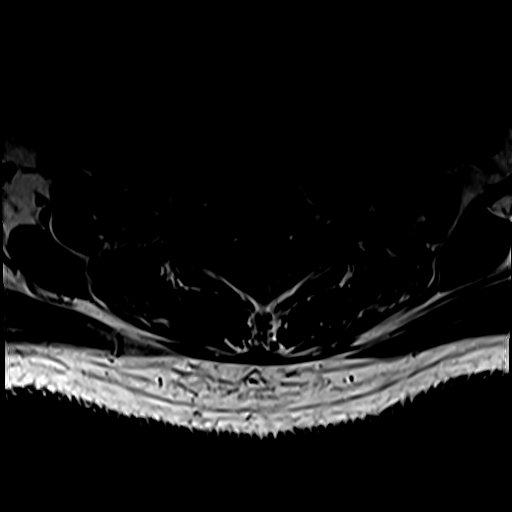
[im 9/30]
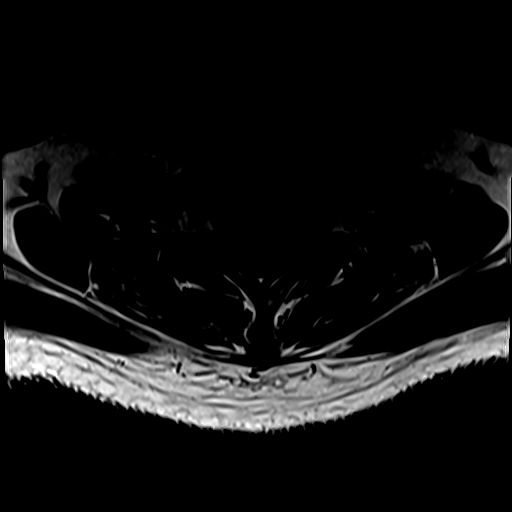
[im 12/30]
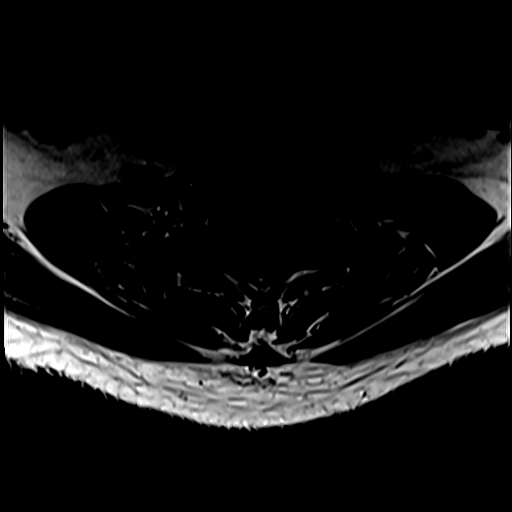
[im 15/30]
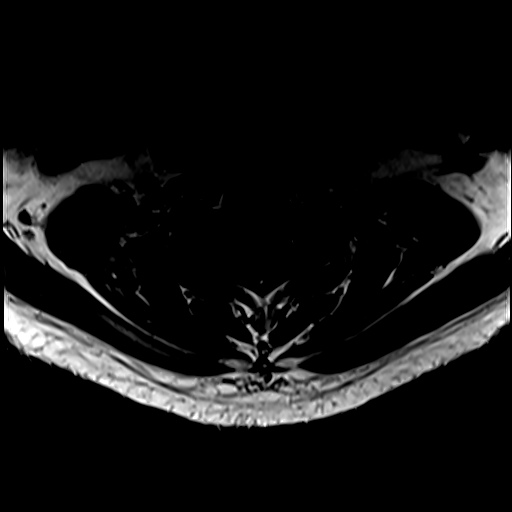
[im 18/30]
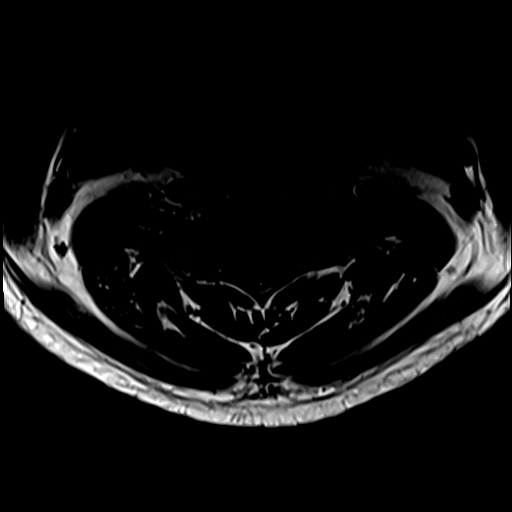
[im 21/30]
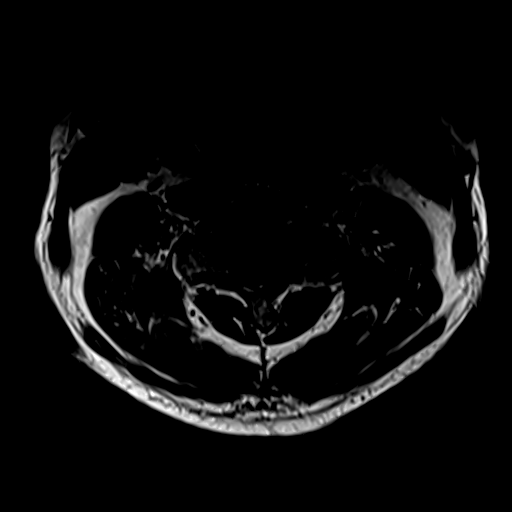
[im 24/30]
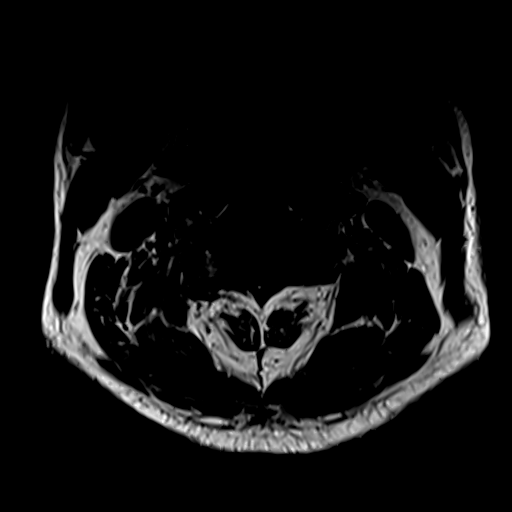
[im 27/30]
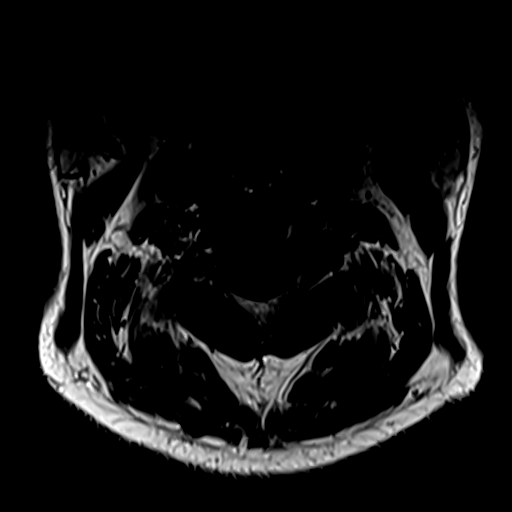
[im 30/30]
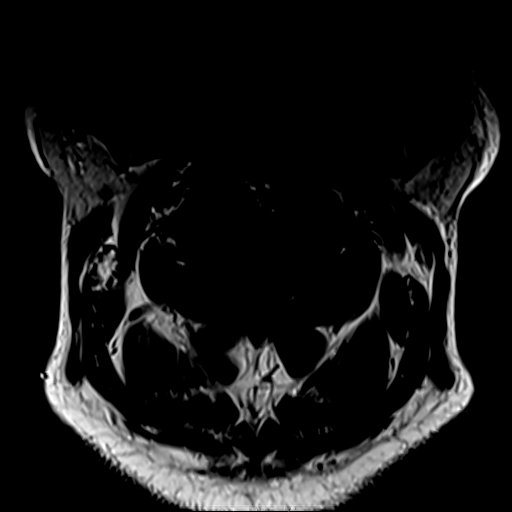

[33 of 48 positions shown; findings below may reference images not displayed]

FINDINGS: Alignment: Grade 1 anterolisthesis of C7 on T1. Straightening of the
cervical lordosis.

Vertebrae: No fracture, evidence of discitis, or bone lesion.

Cord: Normal signal and morphology. Slightly prominent posterior
epidural fat at the cervicothoracic junction and extending into the
visualized thoracic spine.

Posterior Fossa, vertebral arteries, paraspinal tissues: Negative.

Disc levels:

C1-C2: Sagittal sequences only. Advanced arthropathy at C1-2, right
worse than left. Small amount of retro-odontoid degenerative pannus
formation. No canal stenosis.

C2-C3: No disc protrusion. Moderate bilateral facet arthropathy and
right-sided uncovertebral spurring. Findings result in moderate
right and mild left foraminal stenosis. No canal stenosis.

C3-C4: Minimal disc osteophyte complex, eccentric to the right.
Mild-moderate bilateral facet arthropathy and right-sided
uncovertebral spurring. Findings result in moderate right and
mild-moderate left foraminal stenosis. No canal stenosis.

C4-C5: Disc osteophyte complex, eccentric to the right. Moderate
bilateral facet arthropathy with right greater than left
uncovertebral spurring. Findings result in moderate right and mild
left foraminal stenosis. No canal stenosis.

C5-C6: Small right paracentral disc osteophyte complex. Minimal
facet arthropathy. Borderline-mild right foraminal stenosis. No
canal stenosis.

C6-C7: Minimal disc osteophyte complex with right greater than left
facet and uncovertebral arthropathy. Findings result in
mild-to-moderate bilateral foraminal stenosis, right worse than
left. No canal stenosis.

C7-T1: Disc osteophyte complex, eccentric to the right. Bulky
bilateral facet arthropathy and right greater than left
uncovertebral spurring. Findings result in moderate-severe right and
moderate left foraminal stenosis. No canal stenosis.
IMPRESSION: 1. Multilevel degenerative changes of the cervical spine, most
pronounced at the C7-T1 level where there is moderate-to-severe
right and moderate left foraminal stenosis.
2. Moderate right-sided foraminal stenosis is also present at the
C2-3 through C4-5 levels.
3. No canal stenosis of the cervical spine.

## 2022-04-17 ENCOUNTER — Ambulatory Visit (INDEPENDENT_AMBULATORY_CARE_PROVIDER_SITE_OTHER): Payer: Medicare PPO | Admitting: Orthopaedic Surgery

## 2022-04-17 ENCOUNTER — Encounter: Payer: Self-pay | Admitting: Orthopaedic Surgery

## 2022-04-17 DIAGNOSIS — M4802 Spinal stenosis, cervical region: Secondary | ICD-10-CM | POA: Diagnosis not present

## 2022-04-17 NOTE — Progress Notes (Signed)
? ?Office Visit Note ?  ?Patient: Martin Mckee           ?Date of Birth: 02-Sep-1956           ?MRN: [id number] ?Visit Date: 04/17/2022 ?             ?Requested by: Tor Netters, MD ?[address] ?[place],  [place] [address] ?PCP: Tor Netters, MD ? ? ?Assessment & Plan: ?Visit Diagnoses:  ?1. Foraminal stenosis of cervical region   ? ? ?Plan: Follow-up after as EMGs nerve conduction velocities which are in May.  We then again discussed operative choice.  Looking at x-rays his C7-T1 disc is exactly when the top of the clavicle.  He has a relatively short neck. ? ?Follow-Up Instructions: No follow-ups on file.  ? ?Orders:  ?No orders of the defined types were placed in this encounter. ? ?No orders of the defined types were placed in this encounter. ? ? ? ? Procedures: ?No procedures performed ? ? ?Clinical Data: ?No additional findings. ? ? ?Subjective: ?Chief Complaint  ?Patient presents with  ? Neck - Follow-up  ? ? ?HPI patient turns post MRI scan.  This shows severe foraminal stenosis C7-T1 worse on the right than the left.  He continues to have tenderness over the cubital tunnel with positive Tinel's. ? ?Review of Systems 14 point system update unchanged. ? ? ?Objective: ?Vital Signs: Ht 6\' 1"  (1.854 m)   Wt 250 lb (113.4 kg)   BMI 32.98 kg/m?  ? ?Physical Exam ?Constitutional:   ?   Appearance: He is well-developed.  ?HENT:  ?   Head: Normocephalic and atraumatic.  ?   Right Ear: External ear normal.  ?   Left Ear: External ear normal.  ?Eyes:  ?   Pupils: Pupils are equal, round, and reactive to light.  ?Neck:  ?   Thyroid: No thyromegaly.  ?   Trachea: No tracheal deviation.  ?Cardiovascular:  ?   Rate and Rhythm: Normal rate.  ?Pulmonary:  ?   Effort: Pulmonary effort is normal.  ?   Breath sounds: No wheezing.  ?Abdominal:  ?   General: Bowel sounds are normal.  ?   Palpations: Abdomen is soft.  ?Musculoskeletal:  ?   Cervical back: Neck supple.  ?Skin: ?   General: Skin is warm and dry.  ?   Capillary  Refill: Capillary refill takes less than 2 seconds.  ?Neurological:  ?   Mental Status: He is alert and oriented to person, place, and time.  ?Psychiatric:     ?   Behavior: Behavior normal.     ?   Thought Content: Thought content normal.     ?   Judgment: Judgment normal.  ? ? ?Ortho Exam right interosseous atrophy weakness first dorsal interosseous.  Decreased grip strength right normal on the left interosseous strong on the left.  Severe positive Tinel's over the right right cubital tunnel only.  Positive brachial plexus tenderness. ? ?Specialty Comments:  ?No specialty comments available. ? ?Imaging: ?Narrative & Impression  ?CLINICAL DATA:  Neck pain, chronic right C7 weakness, C8 weakness ?  ?EXAM: ?MRI CERVICAL SPINE WITHOUT CONTRAST ?  ?TECHNIQUE: ?Multiplanar, multisequence MR imaging of the cervical spine was ?performed. No intravenous contrast was administered. ?  ?COMPARISON:  None. ?  ?FINDINGS: ?Alignment: Grade 1 anterolisthesis of C7 on T1. Straightening of the ?cervical lordosis. ?  ?Vertebrae: No fracture, evidence of discitis, or bone lesion. ?  ?Cord: Normal signal and morphology. Slightly  prominent posterior ?epidural fat at the cervicothoracic junction and extending into the ?visualized thoracic spine. ?  ?Posterior Fossa, vertebral arteries, paraspinal tissues: Negative. ?  ?Disc levels: ?  ?C1-C2: Sagittal sequences only. Advanced arthropathy at C1-2, right ?worse than left. Small amount of retro-odontoid degenerative pannus ?formation. No canal stenosis. ?  ?C2-C3: No disc protrusion. Moderate bilateral facet arthropathy and ?right-sided uncovertebral spurring. Findings result in moderate ?right and mild left foraminal stenosis. No canal stenosis. ?  ?C3-C4: Minimal disc osteophyte complex, eccentric to the right. ?Mild-moderate bilateral facet arthropathy and right-sided ?uncovertebral spurring. Findings result in moderate right and ?mild-moderate left foraminal stenosis. No canal  stenosis. ?  ?C4-C5: Disc osteophyte complex, eccentric to the right. Moderate ?bilateral facet arthropathy with right greater than left ?uncovertebral spurring. Findings result in moderate right and mild ?left foraminal stenosis. No canal stenosis. ?  ?C5-C6: Small right paracentral disc osteophyte complex. Minimal ?facet arthropathy. Borderline-mild right foraminal stenosis. No ?canal stenosis. ?  ?C6-C7: Minimal disc osteophyte complex with right greater than left ?facet and uncovertebral arthropathy. Findings result in ?mild-to-moderate bilateral foraminal stenosis, right worse than ?left. No canal stenosis. ?  ?C7-T1: Disc osteophyte complex, eccentric to the right. Bulky ?bilateral facet arthropathy and right greater than left ?uncovertebral spurring. Findings result in moderate-severe right and ?moderate left foraminal stenosis. No canal stenosis. ?  ?IMPRESSION: ?1. Multilevel degenerative changes of the cervical spine, most ?pronounced at the C7-T1 level where there is moderate-to-severe ?right and moderate left foraminal stenosis. ?2. Moderate right-sided foraminal stenosis is also present at the ?C2-3 through C4-5 levels. ?3. No canal stenosis of the cervical spine. ?  ?  ?Electronically Signed ?  By: Davina Poke D.O. ?  On: 04/12/2022 17:41  ? ? ? ?PMFS History: ?Patient Active Problem List  ? Diagnosis Date Noted  ? Foraminal stenosis of cervical region 04/17/2022  ? Spinal stenosis of lumbar region 11/10/2021  ? Quadriceps weakness 08/22/2021  ? Unilateral primary osteoarthritis, right knee 02/14/2021  ? S/P total knee arthroplasty, left 12/06/2020  ? ?Past Medical History:  ?Diagnosis Date  ? Arthritis   ? Collapse, lung   ? Eczema   ? GERD (gastroesophageal reflux disease)   ? Gout   ? Hypertension   ?  ?History reviewed. No pertinent family history.  ?Past Surgical History:  ?Procedure Laterality Date  ? KNEE ARTHROSCOPY    ? ROTATOR CUFF REPAIR    ? x2  ? TOTAL KNEE ARTHROPLASTY Left 11/26/2020   ? Procedure: LEFT TOTAL KNEE ARTHROPLASTY-CEMENTED;  Surgeon: Marybelle Killings, MD;  Location: [facility];  Service: Orthopedics;  Laterality: Left;  ? ?Social History  ? ?Occupational History  ? Not on file  ?Tobacco Use  ? Smoking status: Former  ?  Years: 10.00  ?  Types: Cigarettes  ? Smokeless tobacco: Never  ?Substance and Sexual Activity  ? Alcohol use: Yes  ?  Comment: occasionally  ? Drug use: Not on file  ? Sexual activity: Not on file  ? ? ? ? ? ? ?

## 2022-05-07 ENCOUNTER — Encounter: Payer: Self-pay | Admitting: Physical Medicine and Rehabilitation

## 2022-05-07 ENCOUNTER — Ambulatory Visit: Payer: Medicare PPO | Admitting: Physical Medicine and Rehabilitation

## 2022-05-07 DIAGNOSIS — R29898 Other symptoms and signs involving the musculoskeletal system: Secondary | ICD-10-CM

## 2022-05-07 DIAGNOSIS — R202 Paresthesia of skin: Secondary | ICD-10-CM

## 2022-05-07 NOTE — Progress Notes (Signed)
Pt state right hand weakness. Pt state he not able to grip and holding small items. Pt state he doesn't do anything to help ease his pain.  Numeric Pain Rating Scale and Functional Assessment Average Pain 2   In the last MONTH (on 0-10 scale) has pain interfered with the following?  1. General activity like being  able to carry out your everyday physical activities such as walking, climbing stairs, carrying groceries, or moving a chair?  Rating(4)    -BT, -Dye Allergies.

## 2022-05-12 NOTE — Progress Notes (Unsigned)
Martin Mckee - 66 y.o. male MRN [id number]  Date of birth: 21-Aug-1956  Office Visit Note: Visit Date: 05/07/2022 PCP: Babs Bertin, MD Referred by: Eldred Manges, MD  Subjective: Chief Complaint  Patient presents with   Right Hand - Weakness   HPI:  Martin Mckee is a 66 y.o. male who comes in today at the request of Dr. Annell Greening for electrodiagnostic study of the Right upper extremities.  Patient is Right hand dominant.  He reports chronic worsening several month history of right arm pain with weakness of his grip on the right and dropping objects.  He does endorse numbness and tingling at times.  He has been followed by Dr. Annell Greening with MRI of the cervical spine showing severe foraminal stenosis at C7-T1 right much more than left.  No high-grade central stenosis.  No known history of diabetes.  Patient only has 2 labs from 2021 showing blood glucose which were above the normal reading if this was fasting.  Paresthesias at times which are nondermatomal.  No left hand complaints.  ROS Otherwise per HPI.  Assessment & Plan: Visit Diagnoses:    ICD-10-CM   1. Right arm weakness  R29.898 NCV with EMG (electromyography)    2. Paresthesia of skin  R20.2 NCV with EMG (electromyography)      Plan: Impression: The above electrodiagnostic study is ABNORMAL and reveals evidence most consistent of:  A severe right ulnar nerve neuropathy seemingly just distal to the elbow but not at the wrist.  The location is somewhat hard to determine as we usually see a more profound drop across the elbow and in this case both just distal and across the elbow were slowed.  There were active motor units on needle EMG which portends a better outcome.   A moderate median nerve neuropathy at the wrist (carpal tunnel syndrome) affecting the sensory and motor fibers.    Unfortunately, cannot totally rule out C8 radiculopathy as an underlying issue.    There is no significant electrodiagnostic evidence of  any other focal nerve entrapment or brachial plexopathy .   Recommendations: 1.  Follow-up with referring physician. 2.  Continue current management of symptoms. 3.  Suggest surgical evaluation.  Meds & Orders: No orders of the defined types were placed in this encounter.   Orders Placed This Encounter  Procedures   NCV with EMG (electromyography)    Follow-up: Return for Annell Greening, MD.   Procedures: No procedures performed  EMG & NCV Findings: Evaluation of the right median motor nerve showed prolonged distal onset latency (7.0 ms) and decreased conduction velocity (Elbow-Wrist, 40 m/s).  The right ulnar motor nerve showed prolonged distal onset latency (5.0 ms), reduced amplitude (1.4 mV), decreased conduction velocity (B Elbow-Wrist, 33 m/s), and decreased conduction velocity (A Elbow-B Elbow, 20 m/s).  The right median (across palm) sensory nerve showed prolonged distal peak latency (Wrist, 6.4 ms) and prolonged distal peak latency (Palm, 7.9 ms).  The right ulnar sensory nerve showed prolonged distal peak latency (6.0 ms), reduced amplitude (2.6 V), and decreased conduction velocity (Wrist-5th Digit, 23 m/s).  All remaining nerves (as indicated in the following tables) were within normal limits.    Needle evaluation of the right first dorsal interosseous muscle showed increased insertional activity, widespread spontaneous activity, and diminished recruitment.  The right flexor digitorum profundus muscle showed increased insertional activity and slightly increased spontaneous activity.  All remaining muscles (as indicated in the following table) showed no evidence of electrical instability.  Impression: The above electrodiagnostic study is ABNORMAL and reveals evidence most consistent of:  A severe right ulnar nerve neuropathy seemingly just distal to the elbow but not at the wrist.  The location is somewhat hard to determine as we usually see a more profound drop across the elbow  and in this case both just distal and across the elbow were slowed.  There were active motor units on needle EMG which portends a better outcome.   A moderate median nerve neuropathy at the wrist (carpal tunnel syndrome) affecting the sensory and motor fibers.    Unfortunately, cannot totally rule out C8 radiculopathy as an underlying issue.    There is no significant electrodiagnostic evidence of any other focal nerve entrapment or brachial plexopathy .   Recommendations: 1.  Follow-up with referring physician. 2.  Continue current management of symptoms. 3.  Suggest surgical evaluation.  ___________________________ Laurence Spates FAAPMR Board Certified, American Board of Physical Medicine and Rehabilitation    Nerve Conduction Studies Anti Sensory Summary Table   Stim Site NR Peak (ms) Norm Peak (ms) P-T Amp (V) Norm P-T Amp Site1 Site2 Delta-P (ms) Dist (cm) Vel (m/s) Norm Vel (m/s)  Right Median Acr Palm Anti Sensory (2nd Digit)  29.4C  Wrist    *6.4 <3.6 12.7 >10 Wrist Palm 1.5 0.0    Palm    *7.9 <2.0 7.8         Right Radial Anti Sensory (Base 1st Digit)  29.5C  Wrist    2.8 <3.1 12.0  Wrist Base 1st Digit 2.8 0.0    Right Ulnar Anti Sensory (5th Digit)  29.7C  Wrist    *6.0 <3.7 *2.6 >15.0 Wrist 5th Digit 6.0 14.0 *23 >38   Motor Summary Table   Stim Site NR Onset (ms) Norm Onset (ms) O-P Amp (mV) Norm O-P Amp Site1 Site2 Delta-0 (ms) Dist (cm) Vel (m/s) Norm Vel (m/s)  Right Median Motor (Abd Poll Brev)  29.4C  Wrist    *7.0 <4.2 5.9 >5 Elbow Wrist 6.7 27.0 *40 >50  Elbow    13.7  4.7         Right Ulnar Motor (Abd Dig Min)  29.4C  Wrist    *5.0 <4.2 *1.4 >3 B Elbow Wrist 8.6 28.0 *33 >53  B Elbow    13.6  0.9  A Elbow B Elbow 5.6 11.0 *20 >53  A Elbow    19.2  0.4          EMG   Side Muscle Nerve Root Ins Act Fibs Psw Amp Dur Poly Recrt Int Fraser Din Comment  Right 1stDorInt Ulnar C8-T1 *Incr *4+ *4+ Nml Nml 0 *Reduced Nml   Right Abd Poll Brev Median C8-T1 Nml  Nml Nml Nml Nml 0 Nml Nml   Right Triceps Radial C6-7-8 Nml Nml Nml Nml Nml 0 Nml Nml   Right Deltoid Axillary C5-6 Nml Nml Nml Nml Nml 0 Nml Nml   Right Ext Digitorum  Radial (Post Int) C7-8 Nml Nml Nml Nml Nml 0 Nml Nml   Right FlexDigProf Ulnar C8,T1 *Incr *1+ *1+ Nml Nml 0 Nml Nml     Nerve Conduction Studies Anti Sensory Left/Right Comparison   Stim Site L Lat (ms) R Lat (ms) L-R Lat (ms) L Amp (V) R Amp (V) L-R Amp (%) Site1 Site2 L Vel (m/s) R Vel (m/s) L-R Vel (m/s)  Median Acr Palm Anti Sensory (2nd Digit)  29.4C  Wrist  *6.4   12.7  Wrist Palm  Palm  *7.9   7.8        Radial Anti Sensory (Base 1st Digit)  29.5C  Wrist  2.8   12.0  Wrist Base 1st Digit     Ulnar Anti Sensory (5th Digit)  29.7C  Wrist  *6.0   *2.6  Wrist 5th Digit  *23    Motor Left/Right Comparison   Stim Site L Lat (ms) R Lat (ms) L-R Lat (ms) L Amp (mV) R Amp (mV) L-R Amp (%) Site1 Site2 L Vel (m/s) R Vel (m/s) L-R Vel (m/s)  Median Motor (Abd Poll Brev)  29.4C  Wrist  *7.0   5.9  Elbow Wrist  *40   Elbow  13.7   4.7        Ulnar Motor (Abd Dig Min)  29.4C  Wrist  *5.0   *1.4  B Elbow Wrist  *33   B Elbow  13.6   0.9  A Elbow B Elbow  *20   A Elbow  19.2   0.4           Waveforms:             Clinical History: No specialty comments available.     Objective:  VS:  HT:    WT:   BMI:     BP:   HR: bpm  TEMP: ( )  RESP:  Physical Exam Musculoskeletal:        General: No tenderness.     Comments: Inspection reveals atrophy of the right APB and FDI but not atrophy in the left APB or FDI or hand intrinsics. There is no swelling, color changes, allodynia or dystrophic changes. There is 5 out of 5 strength in the bilateral wrist extension and long finger flexion.  There is some weakness with finger abduction on the right compared to the left.  There is intact sensation to light touch in all dermatomal and peripheral nerve distributions. There is a positive Froment's test on the right.  There is a negative Hoffmann's test bilaterally.  Skin:    General: Skin is warm and dry.     Findings: No erythema or rash.  Neurological:     General: No focal deficit present.     Mental Status: He is alert and oriented to person, place, and time.     Sensory: No sensory deficit.     Motor: No weakness or abnormal muscle tone.     Coordination: Coordination normal.     Gait: Gait normal.  Psychiatric:        Mood and Affect: Mood normal.        Behavior: Behavior normal.        Thought Content: Thought content normal.     Imaging: No results found.

## 2022-05-13 NOTE — Procedures (Signed)
EMG & NCV Findings: Evaluation of the right median motor nerve showed prolonged distal onset latency (7.0 ms) and decreased conduction velocity (Elbow-Wrist, 40 m/s).  The right ulnar motor nerve showed prolonged distal onset latency (5.0 ms), reduced amplitude (1.4 mV), decreased conduction velocity (B Elbow-Wrist, 33 m/s), and decreased conduction velocity (A Elbow-B Elbow, 20 m/s).  The right median (across palm) sensory nerve showed prolonged distal peak latency (Wrist, 6.4 ms) and prolonged distal peak latency (Palm, 7.9 ms).  The right ulnar sensory nerve showed prolonged distal peak latency (6.0 ms), reduced amplitude (2.6 V), and decreased conduction velocity (Wrist-5th Digit, 23 m/s).  All remaining nerves (as indicated in the following tables) were within normal limits.    Needle evaluation of the right first dorsal interosseous muscle showed increased insertional activity, widespread spontaneous activity, and diminished recruitment.  The right flexor digitorum profundus muscle showed increased insertional activity and slightly increased spontaneous activity.  All remaining muscles (as indicated in the following table) showed no evidence of electrical instability.    Impression: The above electrodiagnostic study is ABNORMAL and reveals evidence most consistent of:  A severe right ulnar nerve neuropathy seemingly just distal to the elbow but not at the wrist.  The location is somewhat hard to determine as we usually see a more profound drop across the elbow and in this case both just distal and across the elbow were slowed.  There were active motor units on needle EMG which portends a better outcome.   A moderate median nerve neuropathy at the wrist (carpal tunnel syndrome) affecting the sensory and motor fibers.    Unfortunately, cannot totally rule out C8 radiculopathy as an underlying issue.    There is no significant electrodiagnostic evidence of any other focal nerve entrapment or  brachial plexopathy .   Recommendations: 1.  Follow-up with referring physician. 2.  Continue current management of symptoms. 3.  Suggest surgical evaluation.  ___________________________ Naaman Plummer FAAPMR Board Certified, American Board of Physical Medicine and Rehabilitation    Nerve Conduction Studies Anti Sensory Summary Table   Stim Site NR Peak (ms) Norm Peak (ms) P-T Amp (V) Norm P-T Amp Site1 Site2 Delta-P (ms) Dist (cm) Vel (m/s) Norm Vel (m/s)  Right Median Acr Palm Anti Sensory (2nd Digit)  29.4C  Wrist    *6.4 <3.6 12.7 >10 Wrist Palm 1.5 0.0    Palm    *7.9 <2.0 7.8         Right Radial Anti Sensory (Base 1st Digit)  29.5C  Wrist    2.8 <3.1 12.0  Wrist Base 1st Digit 2.8 0.0    Right Ulnar Anti Sensory (5th Digit)  29.7C  Wrist    *6.0 <3.7 *2.6 >15.0 Wrist 5th Digit 6.0 14.0 *23 >38   Motor Summary Table   Stim Site NR Onset (ms) Norm Onset (ms) O-P Amp (mV) Norm O-P Amp Site1 Site2 Delta-0 (ms) Dist (cm) Vel (m/s) Norm Vel (m/s)  Right Median Motor (Abd Poll Brev)  29.4C  Wrist    *7.0 <4.2 5.9 >5 Elbow Wrist 6.7 27.0 *40 >50  Elbow    13.7  4.7         Right Ulnar Motor (Abd Dig Min)  29.4C  Wrist    *5.0 <4.2 *1.4 >3 B Elbow Wrist 8.6 28.0 *33 >53  B Elbow    13.6  0.9  A Elbow B Elbow 5.6 11.0 *20 >53  A Elbow    19.2  0.4  EMG   Side Muscle Nerve Root Ins Act Fibs Psw Amp Dur Poly Recrt Int Dennie Bible Comment  Right 1stDorInt Ulnar C8-T1 *Incr *4+ *4+ Nml Nml 0 *Reduced Nml   Right Abd Poll Brev Median C8-T1 Nml Nml Nml Nml Nml 0 Nml Nml   Right Triceps Radial C6-7-8 Nml Nml Nml Nml Nml 0 Nml Nml   Right Deltoid Axillary C5-6 Nml Nml Nml Nml Nml 0 Nml Nml   Right Ext Digitorum  Radial (Post Int) C7-8 Nml Nml Nml Nml Nml 0 Nml Nml   Right FlexDigProf Ulnar C8,T1 *Incr *1+ *1+ Nml Nml 0 Nml Nml     Nerve Conduction Studies Anti Sensory Left/Right Comparison   Stim Site L Lat (ms) R Lat (ms) L-R Lat (ms) L Amp (V) R Amp (V) L-R Amp (%)  Site1 Site2 L Vel (m/s) R Vel (m/s) L-R Vel (m/s)  Median Acr Palm Anti Sensory (2nd Digit)  29.4C  Wrist  *6.4   12.7  Wrist Palm     Palm  *7.9   7.8        Radial Anti Sensory (Base 1st Digit)  29.5C  Wrist  2.8   12.0  Wrist Base 1st Digit     Ulnar Anti Sensory (5th Digit)  29.7C  Wrist  *6.0   *2.6  Wrist 5th Digit  *23    Motor Left/Right Comparison   Stim Site L Lat (ms) R Lat (ms) L-R Lat (ms) L Amp (mV) R Amp (mV) L-R Amp (%) Site1 Site2 L Vel (m/s) R Vel (m/s) L-R Vel (m/s)  Median Motor (Abd Poll Brev)  29.4C  Wrist  *7.0   5.9  Elbow Wrist  *40   Elbow  13.7   4.7        Ulnar Motor (Abd Dig Min)  29.4C  Wrist  *5.0   *1.4  B Elbow Wrist  *33   B Elbow  13.6   0.9  A Elbow B Elbow  *20   A Elbow  19.2   0.4           Waveforms:

## 2022-05-15 ENCOUNTER — Encounter: Payer: Self-pay | Admitting: Orthopaedic Surgery

## 2022-05-15 ENCOUNTER — Ambulatory Visit (INDEPENDENT_AMBULATORY_CARE_PROVIDER_SITE_OTHER): Payer: Medicare PPO | Admitting: Orthopaedic Surgery

## 2022-05-15 DIAGNOSIS — G5621 Lesion of ulnar nerve, right upper limb: Secondary | ICD-10-CM

## 2022-05-26 DIAGNOSIS — G5621 Lesion of ulnar nerve, right upper limb: Secondary | ICD-10-CM | POA: Insufficient documentation

## 2022-05-26 NOTE — Progress Notes (Signed)
Office Visit Note   Patient: Martin Mckee           Date of Birth: May 14, 1956           MRN: [id number] Visit Date: 05/15/2022              Requested by: Tor Netters, MD [address] [place],  [place] [address] PCP: Tor Netters, MD   Assessment & Plan: Visit Diagnoses:  1. Cubital tunnel syndrome on right     Plan: Reviewed MRI scan as well as electrical test.  Patient's wife was present and included in the discussion.  Would recommend proceeding with ulnar nerve decompression cubital tunnel.  It does not appear that the nerve is subluxing but I discussed with him in detail that if it was found that the nerve is subluxing then we would transfer it since he has severe changes of cubital tunnel on the right.  Left side normal.  We discussed the foraminal stenosis present in his neck possibility that he might need surgery on the cervical spine at some point.  If he gets good relief with the cubital tunnel with improvement in hand sensation and strength in the cervical procedure may not be needed.  He does have lumbar spinal stenosis which is stable with some neurogenic claudication symptoms.  Discussed plan outpatient surgery for cubital tunnel release.  Questions elicited and answered.  He understands request to proceed.  Follow-Up Instructions: No follow-ups on file.   Orders:  No orders of the defined types were placed in this encounter.  No orders of the defined types were placed in this encounter.     Procedures: No procedures performed   Clinical Data: No additional findings.   Subjective: Chief Complaint  Patient presents with   Right Arm - Weakness, Follow-up    EMG/NCS review   Neck - Pain, Follow-up    EMG/NCS review    Weakness Associated symptoms include weakness.  66 year old male returns with ongoing problems with hand hand weakness and numbness in the ulnar distribution.  He has some mild clawing ring and small finger.  Moderate discomfort in the  cervical spine.  Previous cervical MRI showed moderate severe right and moderate left foraminal stenosis at C7-T1.  Moderate right-sided foraminal narrowing at C2-3 through C4-5 levels.  Patient denies any symptoms of right upper extremity.  Review of Systems  Neurological:  Positive for weakness.  previous total knee arthroplasty , lumbar spondylosis with multilevel moderate stenosis.  Otherwise systems noncontributory HPI.   Objective: Vital Signs: Ht 6\' 1"  (1.854 m)   Wt 250 lb (113.4 kg)   BMI 32.98 kg/m   Physical Exam Constitutional:      Appearance: He is well-developed.  HENT:     Head: Normocephalic and atraumatic.     Right Ear: External ear normal.     Left Ear: External ear normal.  Eyes:     Pupils: Pupils are equal, round, and reactive to light.  Neck:     Thyroid: No thyromegaly.     Trachea: No tracheal deviation.  Cardiovascular:     Rate and Rhythm: Normal rate.  Pulmonary:     Effort: Pulmonary effort is normal.     Breath sounds: No wheezing.  Abdominal:     General: Bowel sounds are normal.     Palpations: Abdomen is soft.  Musculoskeletal:     Cervical back: Neck supple.  Skin:    General: Skin is warm and dry.  Capillary Refill: Capillary refill takes less than 2 seconds.  Neurological:     Mental Status: He is alert and oriented to person, place, and time.  Psychiatric:        Behavior: Behavior normal.        Thought Content: Thought content normal.        Judgment: Judgment normal.    Ortho Exam positive Tinel's right cubital tunnel no palpable ulnar nerve subluxation.  Weakness of FCU interossei and greater than 10 mm two-point ulnar distribution right hand.  Left hand shows normal interossei strength no atrophy.  Right hand shows hypothenar atrophy and first dorsal interosseous weakness.  Specialty Comments: Interpretation Summary  EMG & NCV Findings: Evaluation of the right median motor nerve showed prolonged distal onset latency (7.0  ms) and decreased conduction velocity (Elbow-Wrist, 40 m/s).  The right ulnar motor nerve showed prolonged distal onset latency (5.0 ms), reduced amplitude (1.4 mV), decreased conduction velocity (B Elbow-Wrist, 33 m/s), and decreased conduction velocity (A Elbow-B Elbow, 20 m/s).  The right median (across palm) sensory nerve showed prolonged distal peak latency (Wrist, 6.4 ms) and prolonged distal peak latency (Palm, 7.9 ms).  The right ulnar sensory nerve showed prolonged distal peak latency (6.0 ms), reduced amplitude (2.6 V), and decreased conduction velocity (Wrist-5th Digit, 23 m/s).  All remaining nerves (as indicated in the following tables) were within normal limits.     Needle evaluation of the right first dorsal interosseous muscle showed increased insertional activity, widespread spontaneous activity, and diminished recruitment.  The right flexor digitorum profundus muscle showed increased insertional activity and slightly increased spontaneous activity.  All remaining muscles (as indicated in the following table) showed no evidence of electrical instability.     Impression: The above electrodiagnostic study is ABNORMAL and reveals evidence most consistent of:   A severe right ulnar nerve neuropathy seemingly just distal to the elbow but not at the wrist.  The location is somewhat hard to determine as we usually see a more profound drop across the elbow and in this case both just distal and across the elbow were slowed.  There were active motor units on needle EMG which portends a better outcome.    A moderate median nerve neuropathy at the wrist (carpal tunnel syndrome) affecting the sensory and motor fibers.     Unfortunately, cannot totally rule out C8 radiculopathy as an underlying issue.     There is no significant electrodiagnostic evidence of any other focal nerve entrapment or brachial plexopathy .    Recommendations: 1.  Follow-up with referring physician. 2.  Continue current  management of symptoms. 3.  Suggest surgical evaluation.   ___________________________ W[employer] Board Certified, American Board of Physical Medicine and Rehabilitation         Imaging: No results found.   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  Past Surgical History:  Procedure Laterality Date   KNEE ARTHROSCOPY     ROTATOR  CUFF REPAIR     x2   TOTAL KNEE ARTHROPLASTY Left 11/26/2020   Procedure: LEFT TOTAL KNEE ARTHROPLASTY-CEMENTED;  Surgeon: Marybelle Killings, MD;  Location: [facility];  Service: Orthopedics;  Laterality: Left;   Social History   Occupational History   Not on file  Tobacco Use   Smoking status: Former    Years: 10.00    Types: Cigarettes   Smokeless tobacco: Never  Substance and Sexual Activity   Alcohol use: Yes    Comment: occasionally   Drug use: Not on file   Sexual activity: Not on file

## 2022-06-09 ENCOUNTER — Other Ambulatory Visit: Payer: Self-pay | Admitting: Orthopaedic Surgery

## 2022-06-09 MED ORDER — HYDROCODONE-ACETAMINOPHEN 5-325 MG PO TABS: 1.0000 | ORAL_TABLET | Freq: Four times a day (QID) | ORAL | 0 refills | Status: AC | PRN

## 2022-06-19 ENCOUNTER — Ambulatory Visit (INDEPENDENT_AMBULATORY_CARE_PROVIDER_SITE_OTHER): Payer: Medicare PPO | Admitting: Orthopaedic Surgery

## 2022-06-19 ENCOUNTER — Encounter: Payer: Self-pay | Admitting: Orthopaedic Surgery

## 2022-06-19 VITALS — Ht 73.0 in | Wt 250.0 lb

## 2022-06-19 DIAGNOSIS — G5621 Lesion of ulnar nerve, right upper limb: Secondary | ICD-10-CM

## 2022-06-19 NOTE — Progress Notes (Signed)
   Post-Op Visit Note   Patient: Martin Mckee           Date of Birth: 09-Jun-1956           MRN: [id number] Visit Date: 06/19/2022 PCP: Babs Bertin, MD   Assessment & Plan: Post cubital tunnel right arm for ulnar nerve compression and carpal tunnel release.  He has noticed improvement in his ulnar sensation.  Some swelling in his fingers incisions look good return 1 week for suture removal.  Resplint applied.  Chief Complaint:  Chief Complaint  Patient presents with   Right Arm - Routine Post Op    DOS: 06/09/22   Visit Diagnoses:  1. Cubital tunnel syndrome on right     Plan: Return 1 week for suture removal.  Follow-Up Instructions: No follow-ups on file.   Orders:  No orders of the defined types were placed in this encounter.  No orders of the defined types were placed in this encounter.   Imaging: No results found.  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  Past Surgical History:  Procedure Laterality Date   KNEE ARTHROSCOPY     ROTATOR CUFF REPAIR     x2   TOTAL KNEE ARTHROPLASTY Left 11/26/2020   Procedure: LEFT TOTAL KNEE ARTHROPLASTY-CEMENTED;  Surgeon: Eldred Manges, MD;  Location: [facility];  Service: Orthopedics;  Laterality: Left;   Social History   Occupational History   Not on file  Tobacco Use   Smoking status: Former    Years: 10.00    Types: Cigarettes   Smokeless tobacco: Never  Substance and Sexual Activity   Alcohol use: Yes    Comment: occasionally    Drug use: Not on file   Sexual activity: Not on file

## 2022-06-26 ENCOUNTER — Ambulatory Visit (INDEPENDENT_AMBULATORY_CARE_PROVIDER_SITE_OTHER): Payer: Medicare PPO | Admitting: Orthopaedic Surgery

## 2022-06-26 DIAGNOSIS — G5621 Lesion of ulnar nerve, right upper limb: Secondary | ICD-10-CM

## 2022-06-26 DIAGNOSIS — G5601 Carpal tunnel syndrome, right upper limb: Secondary | ICD-10-CM

## 2022-06-26 NOTE — Progress Notes (Signed)
   Post-Op Visit Note   Patient: Martin Mckee           Date of Birth: [birth year]           MRN: [id number] Visit Date: 06/26/2022 PCP: Babs Bertin, MD   Assessment & Plan: Post right ulnar nerve decompression cubital tunnel and carpal tunnel release.  Incision looks good Steri-Strips applied.  He has noticed improvement in sensation he had severe cubital tunnel and has atrophy in his hand with some clawing ring and small finger.  Recheck 4 weeks.  We discussed the foraminal narrowing at C7-T1 right greater than left that was present on his MRI scan.  He can gradually resume his fishing activities.  Chief Complaint:  Chief Complaint  Patient presents with   Other    06/09/22 cubital tunnel right arm for ulnar nerve compression and carpal tunnel release   Visit Diagnoses:  1. Cubital tunnel syndrome on right   2. Carpal tunnel syndrome, right upper limb     Plan: Recheck 4 weeks.  Follow-Up Instructions: Return in about 4 weeks (around 07/24/2022).   Orders:  No orders of the defined types were placed in this encounter.  No orders of the defined types were placed in this encounter.   Imaging: No results found.  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  Past Surgical History:  Procedure Laterality Date   KNEE ARTHROSCOPY     ROTATOR CUFF REPAIR     x2   TOTAL KNEE ARTHROPLASTY Left 11/26/2020   Procedure: LEFT TOTAL KNEE  ARTHROPLASTY-CEMENTED;  Surgeon: Eldred Manges, MD;  Location: [facility];  Service: Orthopedics;  Laterality: Left;   Social History   Occupational History   Not on file  Tobacco Use   Smoking status: Former    Years: 10.00    Types: Cigarettes   Smokeless tobacco: Never  Substance and Sexual Activity   Alcohol use: Yes    Comment: occasionally   Drug use: Not on file   Sexual activity: Not on file

## 2022-07-24 ENCOUNTER — Ambulatory Visit (INDEPENDENT_AMBULATORY_CARE_PROVIDER_SITE_OTHER): Payer: Medicare PPO | Admitting: Orthopaedic Surgery

## 2022-07-24 DIAGNOSIS — G5621 Lesion of ulnar nerve, right upper limb: Secondary | ICD-10-CM

## 2022-07-24 NOTE — Progress Notes (Signed)
   Post-Op Visit Note   Patient: Martin Mckee           Date of Birth: 07-14-1956           MRN: [id number] Visit Date: 07/24/2022 PCP: Babs Bertin, MD   Assessment & Plan: Follow-up right cubital tunnel release.  Slight improvement in sensation.  He still has atrophy clawing ring and small finger.  Slight puffiness at the incision.  Carpal tunnel release looks good.  He can follow-up as needed.  Chief Complaint:  Chief Complaint  Patient presents with   Post-op Follow-up    RUE post op follow up   Visit Diagnoses:  1. Cubital tunnel syndrome on right     Plan: Return as needed.  Follow-Up Instructions: No follow-ups on file.   Orders:  No orders of the defined types were placed in this encounter.  No orders of the defined types were placed in this encounter.   Imaging: No results found.  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  Past Surgical History:  Procedure Laterality Date   KNEE ARTHROSCOPY     ROTATOR CUFF REPAIR     x2   TOTAL KNEE ARTHROPLASTY Left 11/26/2020   Procedure: LEFT TOTAL KNEE ARTHROPLASTY-CEMENTED;  Surgeon: Eldred Manges, MD;  Location: [facility];  Service: Orthopedics;  Laterality: Left;   Social History   Occupational History   Not on file  Tobacco Use   Smoking status: Former    Years: 10.00    Types: Cigarettes   Smokeless tobacco: Never  Substance and Sexual Activity   Alcohol use: Yes     Comment: occasionally   Drug use: Not on file   Sexual activity: Not on file
